# Patient Record
Sex: Male | Born: 1975 | Race: Black or African American | Hispanic: No | Marital: Married | Smoking: Current every day smoker
Health system: Southern US, Community
[De-identification: ages and names within clinical notes are randomized; demographics above are authoritative.]

## PROBLEM LIST (undated history)

## (undated) DIAGNOSIS — S61419A Laceration without foreign body of unspecified hand, initial encounter: Secondary | ICD-10-CM

## (undated) DIAGNOSIS — S66929A Laceration of unspecified muscle, fascia and tendon at wrist and hand level, unspecified hand, initial encounter: Secondary | ICD-10-CM

## (undated) DIAGNOSIS — M5412 Radiculopathy, cervical region: Secondary | ICD-10-CM

## (undated) HISTORY — PX: HAND SURGERY: SHX662

---

## 2000-07-21 ENCOUNTER — Emergency Department (HOSPITAL_COMMUNITY): Admission: EM | Admit: 2000-07-21 | Discharge: 2000-07-21 | Payer: Self-pay | Admitting: Emergency Medicine

## 2000-07-21 ENCOUNTER — Encounter: Payer: Self-pay | Admitting: Emergency Medicine

## 2000-07-31 ENCOUNTER — Ambulatory Visit (HOSPITAL_BASED_OUTPATIENT_CLINIC_OR_DEPARTMENT_OTHER): Admission: RE | Admit: 2000-07-31 | Discharge: 2000-07-31 | Payer: Self-pay | Admitting: Orthopedic Surgery

## 2000-07-31 HISTORY — PX: PERCUTANEOUS PINNING METACARPAL FRACTURE: SUR206

## 2002-10-10 ENCOUNTER — Emergency Department (HOSPITAL_COMMUNITY): Admission: EM | Admit: 2002-10-10 | Discharge: 2002-10-10 | Payer: Self-pay | Admitting: Emergency Medicine

## 2004-04-16 ENCOUNTER — Emergency Department (HOSPITAL_COMMUNITY): Admission: EM | Admit: 2004-04-16 | Discharge: 2004-04-16 | Payer: Self-pay | Admitting: Emergency Medicine

## 2004-07-25 ENCOUNTER — Emergency Department (HOSPITAL_COMMUNITY): Admission: EM | Admit: 2004-07-25 | Discharge: 2004-07-25 | Payer: Self-pay | Admitting: Family Medicine

## 2009-10-13 ENCOUNTER — Emergency Department (HOSPITAL_COMMUNITY): Admission: EM | Admit: 2009-10-13 | Discharge: 2009-10-13 | Payer: Self-pay | Admitting: Emergency Medicine

## 2011-04-27 NOTE — Op Note (Signed)
[name]. [facility]  Patient:    John Hampton, John Hampton                 MRN: [id number] Proc. Date: 07/31/00 Adm. Date:  [id number] Attending:  [name] CC:         Artist Pais Weingold, M.D. x 2   Operative Report  PREOPERATIVE DIAGNOSIS:  Displaced right fifth metacarpal fracture.  POSTOPERATIVE DIAGNOSIS:  Displaced right fifth metacarpal fracture.  PROCEDURE:  Closed reduction and percutaneous fixation of the above fracture using two 0.45 K-wires.  SURGEON:  Artist Pais. Mina Marble, M.D.  ASSISTANT:  Junius Roads. Ireton, P.A.C.  ANESTHESIA:  General anesthesia.  TOURNIQUET TIME:  15 minutes.  COMPLICATIONS:  None.  DRAINS:  None.  DESCRIPTION OF PROCEDURE:  The patient was taken to the operating room and after the induction of adequate of general anesthesia had been obtained, his right upper extremity was prepped and draped in the usual sterile fashion. Esmarch was used to exsanguinate the limb.  The tourniquet was inflated to 250 mmHg.  At this point in time, a manual reduction was performed at the base of the fifth metacarpal fracture and the intraoperative x-ray showed an adequate reduction in both the AP and lateral plane.  At this point in time, and 0.45 K-wire was driven from the lateral cortex of the fifth metacarpal across the fracture site into the base of the fourth metacarpal.  This was confirmed in both AP lateral and oblique views.  At this point in time another 0.45 K-wire was then driven distal to the fracture site again from the fifth metacarpal into the fourth metacarpal providing fixation above and below the fracture site.  Intraoperative x-ray showed adequate reduction in both AP lateral and oblique views.  The pins were then cut and bent outside the skin.  The patient was then placed in a sterile ulnar gutter splint.  The patient tolerated the procedure well and went to the recovery room in stable condition. DD:   07/31/00 TD:  07/31/00 Job: [id number]

## 2013-09-15 ENCOUNTER — Emergency Department (HOSPITAL_COMMUNITY)
Admission: EM | Admit: 2013-09-15 | Discharge: 2013-09-15 | Disposition: A | Discharge status: Home / Self Care | Payer: BC Managed Care – PPO | Attending: Emergency Medicine | Admitting: Emergency Medicine

## 2013-09-15 ENCOUNTER — Emergency Department (HOSPITAL_COMMUNITY): Payer: BC Managed Care – PPO

## 2013-09-15 ENCOUNTER — Encounter (HOSPITAL_COMMUNITY): Payer: Self-pay | Admitting: Nurse Practitioner

## 2013-09-15 DIAGNOSIS — F17200 Nicotine dependence, unspecified, uncomplicated: Secondary | ICD-10-CM | POA: Insufficient documentation

## 2013-09-15 DIAGNOSIS — S60229A Contusion of unspecified hand, initial encounter: Secondary | ICD-10-CM | POA: Insufficient documentation

## 2013-09-15 DIAGNOSIS — S60221A Contusion of right hand, initial encounter: Secondary | ICD-10-CM

## 2013-09-15 MED ORDER — OXYCODONE-ACETAMINOPHEN 5-325 MG PO TABS: 1.0000 | ORAL_TABLET | Freq: Once | ORAL | Status: DC
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
## 2013-09-15 NOTE — ED Notes (Signed)
Pt dc to home. Pt sts understanding to dc instructions. Pt ambulatory to exit without difficulty.  Pt denies need for w/c.  

## 2013-09-18 NOTE — ED Provider Notes (Signed)
Medical screening examination/treatment/procedure(s) were performed by non-physician practitioner and as supervising physician I was immediately available for consultation/collaboration.  Iveth Heidemann R. Alazar Cherian, MD 09/18/13 1019 

## 2014-01-30 ENCOUNTER — Encounter (HOSPITAL_COMMUNITY): Payer: Self-pay | Admitting: Emergency Medicine

## 2014-01-30 ENCOUNTER — Emergency Department (HOSPITAL_COMMUNITY)
Admission: EM | Admit: 2014-01-30 | Discharge: 2014-01-30 | Disposition: A | Discharge status: Home / Self Care | Payer: BC Managed Care – PPO | Attending: Emergency Medicine | Admitting: Emergency Medicine

## 2014-01-30 ENCOUNTER — Emergency Department (HOSPITAL_COMMUNITY): Payer: BC Managed Care – PPO

## 2014-01-30 DIAGNOSIS — S161XXA Strain of muscle, fascia and tendon at neck level, initial encounter: Secondary | ICD-10-CM

## 2014-01-30 DIAGNOSIS — F17200 Nicotine dependence, unspecified, uncomplicated: Secondary | ICD-10-CM | POA: Insufficient documentation

## 2014-01-30 DIAGNOSIS — M25511 Pain in right shoulder: Secondary | ICD-10-CM

## 2014-01-30 DIAGNOSIS — Y92410 Unspecified street and highway as the place of occurrence of the external cause: Secondary | ICD-10-CM | POA: Insufficient documentation

## 2014-01-30 DIAGNOSIS — S139XXA Sprain of joints and ligaments of unspecified parts of neck, initial encounter: Secondary | ICD-10-CM | POA: Insufficient documentation

## 2014-01-30 DIAGNOSIS — S46909A Unspecified injury of unspecified muscle, fascia and tendon at shoulder and upper arm level, unspecified arm, initial encounter: Secondary | ICD-10-CM | POA: Insufficient documentation

## 2014-01-30 DIAGNOSIS — S4980XA Other specified injuries of shoulder and upper arm, unspecified arm, initial encounter: Secondary | ICD-10-CM | POA: Insufficient documentation

## 2014-01-30 DIAGNOSIS — Y9389 Activity, other specified: Secondary | ICD-10-CM | POA: Insufficient documentation

## 2014-01-30 MED ORDER — METHOCARBAMOL 500 MG PO TABS: 500.0000 mg | ORAL_TABLET | Freq: Two times a day (BID) | ORAL | Status: DC
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
## 2014-01-30 NOTE — Discharge Instructions (Signed)
Please call and set-up an appointment with orthopedics, Dr. Magnus Ivan if symptoms are to worsen Please rest and stay hydrated Please take medications as prescribed-muscle relaxer Please apply with warm compressions and massage with icy hot ointment to the neck to aid in muscular relief Please avoid any physical or strenuous activity Please continue to monitor symptoms closely if symptoms are to worsen or change (fever greater 101, chills, numbness, tingling, weakness, fall, injury, headache, blurred vision or visual changes, chest pain, shortness of breath, difficulty breathing, inability to open close the jaw line) please report back to emergency department immediately  Cervical Strain and Sprain (Whiplash) with Rehab Cervical strain and sprains are injuries that commonly occur with "whiplash" injuries. Whiplash occurs when the neck is forcefully whipped backward or forward, such as during a motor vehicle accident. The muscles, ligaments, tendons, discs and nerves of the neck are susceptible to injury when this occurs. SYMPTOMS   Pain or stiffness in the front and/or back of neck  Symptoms may present immediately or up to 24 hours after injury.  Dizziness, headache, nausea and vomiting.  Muscle spasm with soreness and stiffness in the neck.  Tenderness and swelling at the injury site. CAUSES  Whiplash injuries often occur during contact sports or motor vehicle accidents.  RISK INCREASES WITH:  Osteoarthritis of the spine.  Situations that make head or neck accidents or trauma more likely.  High-risk sports (football, rugby, wrestling, hockey, auto racing, gymnastics, diving, contact karate or boxing).  Poor strength and flexibility of the neck.  Previous neck injury.  Poor tackling technique.  Improperly fitted or padded equipment. PREVENTION  Learn and use proper technique (avoid tackling with the head, spearing and head-butting; use proper falling techniques to avoid landing  on the head).  Warm up and stretch properly before activity.  Maintain physical fitness:  Strength, flexibility and endurance.  Cardiovascular fitness.  Wear properly fitted and padded protective equipment, such as padded soft collars, for participation in contact sports. PROGNOSIS  Recovery for cervical strain and sprain injuries is dependent on the extent of the injury. These injuries are usually curable in 1 week to 3 months with appropriate treatment.  RELATED COMPLICATIONS   Temporary numbness and weakness may occur if the nerve roots are damaged, and this may persist until the nerve has completely healed.  Chronic pain due to frequent recurrence of symptoms.  Prolonged healing, especially if activity is resumed too soon (before complete recovery). TREATMENT  Treatment initially involves the use of ice and medication to help reduce pain and inflammation. It is also important to perform strengthening and stretching exercises and modify activities that worsen symptoms so the injury does not get worse. These exercises may be performed at home or with a therapist. For patients who experience severe symptoms, a soft padded collar may be recommended to be worn around the neck.  Improving your posture may help reduce symptoms. Posture improvement includes pulling your chin and abdomen in while sitting or standing. If you are sitting, sit in a firm chair with your buttocks against the back of the chair. While sleeping, try replacing your pillow with a small towel rolled to 2 inches in diameter, or use a cervical pillow or soft cervical collar. Poor sleeping positions delay healing.  For patients with nerve root damage, which causes numbness or weakness, the use of a cervical traction apparatus may be recommended. Surgery is rarely necessary for these injuries. However, cervical strain and sprains that are present at birth (congenital) may  require surgery. MEDICATION   If pain medication is  necessary, nonsteroidal anti-inflammatory medications, such as aspirin and ibuprofen, or other minor pain relievers, such as acetaminophen, are often recommended.  Do not take pain medication for 7 days before surgery.  Prescription pain relievers may be given if deemed necessary by your caregiver. Use only as directed and only as much as you need. HEAT AND COLD:   Cold treatment (icing) relieves pain and reduces inflammation. Cold treatment should be applied for 10 to 15 minutes every 2 to 3 hours for inflammation and pain and immediately after any activity that aggravates your symptoms. Use ice packs or an ice massage.  Heat treatment may be used prior to performing the stretching and strengthening activities prescribed by your caregiver, physical therapist, or athletic trainer. Use a heat pack or a warm soak. SEEK MEDICAL CARE IF:   Symptoms get worse or do not improve in 2 weeks despite treatment.  New, unexplained symptoms develop (drugs used in treatment may produce side effects). EXERCISES RANGE OF MOTION (ROM) AND STRETCHING EXERCISES - Cervical Strain and Sprain These exercises may help you when beginning to rehabilitate your injury. In order to successfully resolve your symptoms, you must improve your posture. These exercises are designed to help reduce the forward-head and rounded-shoulder posture which contributes to this condition. Your symptoms may resolve with or without further involvement from your physician, physical therapist or athletic trainer. While completing these exercises, remember:   Restoring tissue flexibility helps normal motion to return to the joints. This allows healthier, less painful movement and activity.  An effective stretch should be held for at least 20 seconds, although you may need to begin with shorter hold times for comfort.  A stretch should never be painful. You should only feel a gentle lengthening or release in the stretched tissue. STRETCH-  Axial Extensors  Lie on your back on the floor. You may bend your knees for comfort. Place a rolled up hand towel or dish towel, about 2 inches in diameter, under the part of your head that makes contact with the floor.  Gently tuck your chin, as if trying to make a "double chin," until you feel a gentle stretch at the base of your head.  Hold __________ seconds. Repeat __________ times. Complete this exercise __________ times per day.  STRETECH - Axial Extension   Stand or sit on a firm surface. Assume a good posture: chest up, shoulders drawn back, abdominal muscles slightly tense, knees unlocked (if standing) and feet hip width apart.  Slowly retract your chin so your head slides back and your chin slightly lowers.Continue to look straight ahead.  You should feel a gentle stretch in the back of your head. Be certain not to feel an aggressive stretch since this can cause headaches later.  Hold for __________ seconds. Repeat __________ times. Complete this exercise __________ times per day. STRETCH  Cervical Side Bend   Stand or sit on a firm surface. Assume a good posture: chest up, shoulders drawn back, abdominal muscles slightly tense, knees unlocked (if standing) and feet hip width apart.  Without letting your nose or shoulders move, slowly tip your right / left ear to your shoulder until your feel a gentle stretch in the muscles on the opposite side of your neck.  Hold __________ seconds. Repeat __________ times. Complete this exercise __________ times per day. STRETCH  Cervical Rotators   Stand or sit on a firm surface. Assume a good posture: chest up, shoulders  drawn back, abdominal muscles slightly tense, knees unlocked (if standing) and feet hip width apart.  Keeping your eyes level with the ground, slowly turn your head until you feel a gentle stretch along the back and opposite side of your neck.  Hold __________ seconds. Repeat __________ times. Complete this exercise  __________ times per day. RANGE OF MOTION - Neck Circles   Stand or sit on a firm surface. Assume a good posture: chest up, shoulders drawn back, abdominal muscles slightly tense, knees unlocked (if standing) and feet hip width apart.  Gently roll your head down and around from the back of one shoulder to the back of the other. The motion should never be forced or painful.  Repeat the motion 10-20 times, or until you feel the neck muscles relax and loosen. Repeat __________ times. Complete the exercise __________ times per day. STRENGTHENING EXERCISES - Cervical Strain and Sprain These exercises may help you when beginning to rehabilitate your injury. They may resolve your symptoms with or without further involvement from your physician, physical therapist or athletic trainer. While completing these exercises, remember:   Muscles can gain both the endurance and the strength needed for everyday activities through controlled exercises.  Complete these exercises as instructed by your physician, physical therapist or athletic trainer. Progress the resistance and repetitions only as guided.  You may experience muscle soreness or fatigue, but the pain or discomfort you are trying to eliminate should never worsen during these exercises. If this pain does worsen, stop and make certain you are following the directions exactly. If the pain is still present after adjustments, discontinue the exercise until you can discuss the trouble with your clinician. STRENGTH Cervical Flexors, Isometric  Face a wall, standing about 6 inches away. Place a small pillow, a ball about 6-8 inches in diameter, or a folded towel between your forehead and the wall.  Slightly tuck your chin and gently push your forehead into the soft object. Push only with mild to moderate intensity, building up tension gradually. Keep your jaw and forehead relaxed.  Hold 10 to 20 seconds. Keep your breathing relaxed.  Release the tension  slowly. Relax your neck muscles completely before you start the next repetition. Repeat __________ times. Complete this exercise __________ times per day. STRENGTH- Cervical Lateral Flexors, Isometric   Stand about 6 inches away from a wall. Place a small pillow, a ball about 6-8 inches in diameter, or a folded towel between the side of your head and the wall.  Slightly tuck your chin and gently tilt your head into the soft object. Push only with mild to moderate intensity, building up tension gradually. Keep your jaw and forehead relaxed.  Hold 10 to 20 seconds. Keep your breathing relaxed.  Release the tension slowly. Relax your neck muscles completely before you start the next repetition. Repeat __________ times. Complete this exercise __________ times per day. STRENGTH  Cervical Extensors, Isometric   Stand about 6 inches away from a wall. Place a small pillow, a ball about 6-8 inches in diameter, or a folded towel between the back of your head and the wall.  Slightly tuck your chin and gently tilt your head back into the soft object. Push only with mild to moderate intensity, building up tension gradually. Keep your jaw and forehead relaxed.  Hold 10 to 20 seconds. Keep your breathing relaxed.  Release the tension slowly. Relax your neck muscles completely before you start the next repetition. Repeat __________ times. Complete this exercise __________  times per day. POSTURE AND BODY MECHANICS CONSIDERATIONS - Cervical Strain and Sprain Keeping correct posture when sitting, standing or completing your activities will reduce the stress put on different body tissues, allowing injured tissues a chance to heal and limiting painful experiences. The following are general guidelines for improved posture. Your physician or physical therapist will provide you with any instructions specific to your needs. While reading these guidelines, remember:  The exercises prescribed by your provider will  help you have the flexibility and strength to maintain correct postures.  The correct posture provides the optimal environment for your joints to work. All of your joints have less wear and tear when properly supported by a spine with good posture. This means you will experience a healthier, less painful body.  Correct posture must be practiced with all of your activities, especially prolonged sitting and standing. Correct posture is as important when doing repetitive low-stress activities (typing) as it is when doing a single heavy-load activity (lifting). PROLONGED STANDING WHILE SLIGHTLY LEANING FORWARD When completing a task that requires you to lean forward while standing in one place for a long time, place either foot up on a stationary 2-4 inch high object to help maintain the best posture. When both feet are on the ground, the low back tends to lose its slight inward curve. If this curve flattens (or becomes too large), then the back and your other joints will experience too much stress, fatigue more quickly and can cause pain.  RESTING POSITIONS Consider which positions are most painful for you when choosing a resting position. If you have pain with flexion-based activities (sitting, bending, stooping, squatting), choose a position that allows you to rest in a less flexed posture. You would want to avoid curling into a fetal position on your side. If your pain worsens with extension-based activities (prolonged standing, working overhead), avoid resting in an extended position such as sleeping on your stomach. Most people will find more comfort when they rest with their spine in a more neutral position, neither too rounded nor too arched. Lying on a non-sagging bed on your side with a pillow between your knees, or on your back with a pillow under your knees will often provide some relief. Keep in mind, being in any one position for a prolonged period of time, no matter how correct your posture, can  still lead to stiffness. WALKING Walk with an upright posture. Your ears, shoulders and hips should all line-up. OFFICE WORK When working at a desk, create an environment that supports good, upright posture. Without extra support, muscles fatigue and lead to excessive strain on joints and other tissues. CHAIR:  A chair should be able to slide under your desk when your back makes contact with the back of the chair. This allows you to work closely.  The chair's height should allow your eyes to be level with the upper part of your monitor and your hands to be slightly lower than your elbows.  Body position:  Your feet should make contact with the floor. If this is not possible, use a foot rest.  Keep your ears over your shoulders. This will reduce stress on your neck and low back. Document Released: 11/26/2005 Document Revised: 03/23/2013 Document Reviewed: 03/10/2009 [facility] Patient Information 2014 [place], [place].   Emergency Department Resource Guide 1) Find a Doctor and Pay Out of Pocket Although you won't have to find out who is covered by your insurance plan, it is a good idea to ask around  and get recommendations. You will then need to call the office and see if the doctor you have chosen will accept you as a new patient and what types of options they offer for patients who are self-pay. Some doctors offer discounts or will set up payment plans for their patients who do not have insurance, but you will need to ask so you aren't surprised when you get to your appointment.  2) Contact Your Local Health Department Not all health departments have doctors that can see patients for sick visits, but many do, so it is worth a call to see if yours does. If you don't know where your local health department is, you can check in your phone book. The CDC also has a tool to help you locate your state's health department, and many state websites also have listings of all of their local health  departments.  3) Find a Walk-in Clinic If your illness is not likely to be very severe or complicated, you may want to try a walk in clinic. These are popping up all over the country in pharmacies, drugstores, and shopping centers. They're usually staffed by nurse practitioners or physician assistants that have been trained to treat common illnesses and complaints. They're usually fairly quick and inexpensive. However, if you have serious medical issues or chronic medical problems, these are probably not your best option.  No Primary Care Doctor: - Call Health Connect at  [phone] - they can help you locate a primary care doctor that  accepts your insurance, provides certain services, etc. - Physician Referral Service- [phone]  Chronic Pain Problems: Organization         Address  Phone   Notes  [facility]  [phone] Patients need to be referred by their primary care doctor.   Medication Assistance: Organization         Address  Phone   Notes  [facility] [address] [place], [place] [address] [phone] --Must be a resident of [facility] -- Must have NO insurance coverage whatsoever (no Medicaid/ Medicare, etc.) -- The pt. MUST have a primary care doctor that directs their care regularly and follows them in the community   MedAssist  [phone]   [employer]  [phone]    Agencies that provide inexpensive medical care: Organization         Address  Phone   Notes  [facility]  [phone]   [facility]    [phone]   [facility] [address] [place], [place] [address] [phone]   [facility] [address], [place] [phone]   [facility]    [phone]   [facility]    [phone]   [facility]  [address], [place] Phone:  [phone]  [phone], Fax:  [phone] Hours of Operation:  9 am - 6 pm, M-F.  Also accepts Medicaid/Medicare and self-pay.  [facility]  [address], [place] Phone: [phone], Fax: [phone]) [phone]. Hours of Operation:  8:30 am - 5:30 pm, M-F.  Also accepts Medicaid and self-pay.  [facility] [place] [address], [place] Phone: [phone]   [facility] [address], [place], [place] [phone], Ext. [phone] Mondays & Thursdays: 7-9 AM.  First 15 patients are seen on  a first come, first serve basis.    Medicaid-accepting [facility] Providers:  Organization         Address  Phone   Notes  [facility] [address], [place] [phone] Also accepts self-pay patients.  [facility] [address] Laurell Josephs [place], [place]  [phone]   [facility] [address], [place] [phone]   [facility] [address] [place] [phone]   Renaye Rakers [address], [place]   [phone] Only accepts [place] patients after they have their name applied to their card.   Self-Pay (no insurance) in [facility]:  Organization         Address  Phone   Notes  Sickle Cell Patients, [facility] [address] [place], [place] [phone]   [facility] [address] [place], [place] [phone]   [facility]  [address], [place] [phone]   [facility]/Dr. Osei-Bonsu  [address], [place] or [address], [place] [phone] Phone number for both [place] and [place] locations is the same.  [facility] [address] [place] [phone]   [facility] [address], [place] or [address] [phone]   [facility] [address], [place] [phone], phone; [phone], fax Sees patients 1st and 3rd Saturday of every month.  Must not qualify for public or private insurance (i.e. Medicaid, Medicare, [place] Health Choice, Veterans' Benefits)  Household income should be no more than 200% of the poverty level The clinic cannot treat you if you are pregnant or think you are pregnant  Sexually transmitted diseases are not treated at the clinic.    Dental Care: Organization         Address  Phone  Notes  [facility] [address] [place], [place] [phone] Accepts children up to age 47 who are enrolled in [place] or [place] Health Choice; pregnant women with a Medicaid card; and children who have applied for Medicaid or [place] Health Choice, but were declined, whose parents can pay a reduced fee at time of service.  [facility] Department of [facility]  [address], [place] [phone] Accepts children up to age 11 who are enrolled in [place] or [place] Health Choice; pregnant women with a Medicaid card; and children who have applied for Medicaid or [place] Health Choice, but were declined, whose parents can pay a reduced fee at time of service.  [facility]  [address] [facility] [place], [place] [phone] Patients are seen by appointment only. Walk-ins are not accepted. [facility] will see patients 18 years of age and older. Monday - Tuesday (8am-5pm) Most Wednesdays (8:30-5pm) $30 per visit, cash only  [facility]  [address], [facility] [phone] Patients are seen by appointment only. Walk-ins are not accepted. [facility] will see patients 57 years of age and older. One Wednesday Evening (Monthly: Volunteer Based).  $30 per visit, cash only  [employer]  [phone] for adults;  Children under age 66, call Graduate Pediatric Dentistry at [phone]. Children  aged 41-14, please call [phone] to request a pediatric application.  Dental services are provided in all areas of dental care including fillings, crowns and bridges, complete and partial dentures, implants, gum treatment, root canals, and extractions. Preventive care is also provided. Treatment is provided to both adults and children. Patients are selected via a lottery and there is often a waiting list.   [facility] [address], [place]  [phone] [email or web address]   [facility] [address] [place], [place] [phone], Ext. [phone] Second and Fourth Thursday of each month, opens at 6:30 AM; Clinic ends at 9 AM.  Patients are seen on a first-come first-served basis, and a limited number are seen during each clinic.   [facility]  [address] [place], [place] [phone]   Eligibility Requirements You must have lived in [place], [place], or [place] counties for at least the last three months.   You cannot be eligible for state or federal sponsored [place], including [employer], [place], or [employer].   You generally cannot be eligible for healthcare insurance through your employer.    How to apply: Eligibility screenings are held every Tuesday and Wednesday afternoon from 1:00 pm until 4:00 pm. You do not need an appointment for the interview!  [facility] [address] [place], [place] [phone]   [facility]  [phone]   [facility] Department  [phone]   [facility] Department  [phone]    Behavioral Health Resources in the Community: Intensive Outpatient Programs Organization         Address  Phone  Notes  [facility] Services [address], [place], [place] [phone]   [facility] Outpatient [address], [place], [place] [phone]   [facility] [address] [place], [place]  [phone]   [facility] [address],  [place], [place] [phone] or [phone]   Substance Abuse Resources Organization         Address  Phone  Notes  [facility]  [phone]   [facility]  [phone]   [facility]  [phone]   Floydene Flock  [phone]   Residential & Outpatient Substance Abuse Program  [phone]   Psychological Services Organization         Address  Phone  Notes  [facility]  [phone]   [facility] Services  [phone]   [facility] Mental Health [address] [place] [phone] or [phone]    Mobile Crisis Teams Organization         Address  Phone  Notes  [facility], Mobile Crisis Care Unit  [phone]   [facility]  [address] [place], [place] [phone]   Doristine Locks [address] [place] [phone]    Self-Help/Support Groups Organization         Address  Phone             Notes  [facility] [place] - variety of support groups  [phone]- [phone] Call for more information  Narcotics Anonymous (NA), [facility] [address], [employer] [place]  2 meetings at this location   Statistician         Address  Phone  Notes  [facility] [address],    [place]  [phone]  [facility]  [address] [place] [address], [place], [place] [phone]   [facility] [address] [place], [place] [phone] Admissions: 8am-3pm M-F  [facility] [address],    [place], [place] [phone]   [facility] [address] [place], [place], [place] [phone]   [facility] [address]  [place], [place] [phone]   [facility] - Intensive  Outpatient [address], [place], [place] [phone]   [facility] ([facility] [address] [place]  [place], [place] [phone] or [phone]   [facility] ([facility]) [address], [place], [place] [phone] Accepts Medicaid  [facility] [address]  [place] [phone] Substance Abuse/Addiction Treatment   [facility] Organization         Address  Phone  Notes  [facility]  [phone]   Angie Fava, PhD [address] [place], [place]   [phone] or [phone]   [facility]   [address] [place], [place] [phone]   [facility] [address] [place], [place] [phone] Insurance/Medicaid/sponsorship through [facility] [address]                                    [place], [place] [phone] Therapy/tele-psych/case  [facility] [address], [place] [phone]    Dr. Lolly Mustache  [phone]   [facility]  [employer] [facility] Dept. 1) [address], [place] 2) [address] [place] 3)  [address], [place] [phone]  [phone]   [facility] Child Abuse Hotline ([phone])360-9983 or [phone] (After Hours)
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
## 2014-01-31 NOTE — ED Provider Notes (Signed)
Medical screening examination/treatment/procedure(s) were performed by non-physician practitioner and as supervising physician I was immediately available for consultation/collaboration.  EKG Interpretation   None        Shon Batonourtney F Shritha Bresee, MD 01/31/14 1919

## 2014-07-09 DIAGNOSIS — S61419A Laceration without foreign body of unspecified hand, initial encounter: Secondary | ICD-10-CM

## 2014-07-09 DIAGNOSIS — S66929A Laceration of unspecified muscle, fascia and tendon at wrist and hand level, unspecified hand, initial encounter: Secondary | ICD-10-CM

## 2014-07-09 HISTORY — DX: Laceration of unspecified muscle, fascia and tendon at wrist and hand level, unspecified hand, initial encounter: S66.929A

## 2014-07-09 HISTORY — DX: Laceration without foreign body of unspecified hand, initial encounter: S61.419A

## 2014-07-10 ENCOUNTER — Emergency Department (HOSPITAL_COMMUNITY)
Admission: EM | Admit: 2014-07-10 | Discharge: 2014-07-10 | Disposition: A | Discharge status: Home / Self Care | Payer: BC Managed Care – PPO | Attending: Emergency Medicine | Admitting: Emergency Medicine

## 2014-07-10 ENCOUNTER — Encounter (HOSPITAL_COMMUNITY): Payer: Self-pay | Admitting: Emergency Medicine

## 2014-07-10 DIAGNOSIS — Z23 Encounter for immunization: Secondary | ICD-10-CM | POA: Insufficient documentation

## 2014-07-10 DIAGNOSIS — Y9389 Activity, other specified: Secondary | ICD-10-CM | POA: Insufficient documentation

## 2014-07-10 DIAGNOSIS — S56429A Laceration of extensor muscle, fascia and tendon of unspecified finger at forearm level, initial encounter: Secondary | ICD-10-CM

## 2014-07-10 DIAGNOSIS — S61209A Unspecified open wound of unspecified finger without damage to nail, initial encounter: Secondary | ICD-10-CM | POA: Insufficient documentation

## 2014-07-10 DIAGNOSIS — Y9289 Other specified places as the place of occurrence of the external cause: Secondary | ICD-10-CM | POA: Insufficient documentation

## 2014-07-10 DIAGNOSIS — F17200 Nicotine dependence, unspecified, uncomplicated: Secondary | ICD-10-CM | POA: Insufficient documentation

## 2014-07-10 DIAGNOSIS — IMO0002 Reserved for concepts with insufficient information to code with codable children: Secondary | ICD-10-CM | POA: Insufficient documentation

## 2014-07-10 MED ORDER — TETANUS-DIPHTH-ACELL PERTUSSIS 5-2.5-18.5 LF-MCG/0.5 IM SUSP
0.5000 mL | Freq: Once | INTRAMUSCULAR | Status: AC
  Administered 2014-07-10: 0.5 mL via INTRAMUSCULAR

## 2014-07-10 MED ORDER — SULFAMETHOXAZOLE-TMP DS 800-160 MG PO TABS: 1.0000 | ORAL_TABLET | Freq: Two times a day (BID) | ORAL | Status: DC

## 2014-07-10 MED ORDER — SULFAMETHOXAZOLE-TMP DS 800-160 MG PO TABS
1.0000 | ORAL_TABLET | Freq: Once | ORAL | Status: AC
  Administered 2014-07-10: 1 via ORAL
  Filled 2014-07-10: qty 1

## 2014-07-10 MED ORDER — TETANUS-DIPHTH-ACELL PERTUSSIS 5-2.5-18.5 LF-MCG/0.5 IM SUSP
0.5000 mL | Freq: Once | INTRAMUSCULAR | Status: DC
  Filled 2014-07-10: qty 0.5

## 2014-07-10 NOTE — ED Notes (Signed)
Patient is alert and oriented x3.  He was given DC instructions and follow up visit instructions.  Patient gave verbal understanding.  He was DC ambulatory under his own power to home.  V/S stable.  He was not showing any signs of distress on DC 
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
## 2014-07-10 NOTE — Discharge Instructions (Signed)
You cut the tendon in your right thumb  It has been sutured in place but will need further repair by the hand surgeon. You have been placed on an antibiotic to prevent infection and placed in a splint to minimize further damage. Please call DR. Merrilee SeashoreKuzma's office on monday to arrange a follow up appointment
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
## 2014-07-12 ENCOUNTER — Other Ambulatory Visit: Payer: Self-pay | Admitting: Orthopedic Surgery

## 2014-07-12 ENCOUNTER — Encounter (HOSPITAL_BASED_OUTPATIENT_CLINIC_OR_DEPARTMENT_OTHER): Payer: Self-pay | Admitting: *Deleted

## 2014-07-12 NOTE — ED Provider Notes (Signed)
Medical screening examination/treatment/procedure(s) were performed by non-physician practitioner and as supervising physician I was immediately available for consultation/collaboration.   EKG Interpretation None       Sharmila Wrobleski, MD 07/12/14 1105 

## 2014-07-13 ENCOUNTER — Ambulatory Visit (HOSPITAL_BASED_OUTPATIENT_CLINIC_OR_DEPARTMENT_OTHER)
Admission: RE | Admit: 2014-07-13 | Discharge: 2014-07-13 | Disposition: A | Discharge status: Home / Self Care | Payer: BC Managed Care – PPO | Source: Ambulatory Visit | Attending: Orthopedic Surgery | Admitting: Orthopedic Surgery

## 2014-07-13 ENCOUNTER — Encounter (HOSPITAL_BASED_OUTPATIENT_CLINIC_OR_DEPARTMENT_OTHER)
Admission: RE | Disposition: A | Discharge status: Home / Self Care | Payer: Self-pay | Source: Ambulatory Visit | Attending: Orthopedic Surgery

## 2014-07-13 ENCOUNTER — Ambulatory Visit (HOSPITAL_BASED_OUTPATIENT_CLINIC_OR_DEPARTMENT_OTHER): Payer: BC Managed Care – PPO | Admitting: Certified Registered"

## 2014-07-13 ENCOUNTER — Encounter (HOSPITAL_BASED_OUTPATIENT_CLINIC_OR_DEPARTMENT_OTHER): Payer: BC Managed Care – PPO | Admitting: Certified Registered"

## 2014-07-13 ENCOUNTER — Encounter (HOSPITAL_BASED_OUTPATIENT_CLINIC_OR_DEPARTMENT_OTHER): Payer: Self-pay | Admitting: Certified Registered"

## 2014-07-13 DIAGNOSIS — F17200 Nicotine dependence, unspecified, uncomplicated: Secondary | ICD-10-CM | POA: Insufficient documentation

## 2014-07-13 DIAGNOSIS — Y9289 Other specified places as the place of occurrence of the external cause: Secondary | ICD-10-CM | POA: Insufficient documentation

## 2014-07-13 DIAGNOSIS — S66909A Unspecified injury of unspecified muscle, fascia and tendon at wrist and hand level, unspecified hand, initial encounter: Principal | ICD-10-CM

## 2014-07-13 DIAGNOSIS — S56909A Unspecified injury of unspecified muscles, fascia and tendons at forearm level, unspecified arm, initial encounter: Secondary | ICD-10-CM

## 2014-07-13 DIAGNOSIS — Y9389 Activity, other specified: Secondary | ICD-10-CM | POA: Insufficient documentation

## 2014-07-13 DIAGNOSIS — Z885 Allergy status to narcotic agent status: Secondary | ICD-10-CM | POA: Insufficient documentation

## 2014-07-13 DIAGNOSIS — S51809A Unspecified open wound of unspecified forearm, initial encounter: Secondary | ICD-10-CM | POA: Insufficient documentation

## 2014-07-13 DIAGNOSIS — Y998 Other external cause status: Secondary | ICD-10-CM | POA: Insufficient documentation

## 2014-07-13 DIAGNOSIS — S61409A Unspecified open wound of unspecified hand, initial encounter: Secondary | ICD-10-CM | POA: Insufficient documentation

## 2014-07-13 DIAGNOSIS — W268XXA Contact with other sharp object(s), not elsewhere classified, initial encounter: Secondary | ICD-10-CM | POA: Insufficient documentation

## 2014-07-13 HISTORY — PX: TENDON REPAIR: SHX5111

## 2014-07-13 HISTORY — DX: Laceration without foreign body of unspecified hand, initial encounter: S61.419A

## 2014-07-13 HISTORY — PX: WOUND EXPLORATION: SHX6188

## 2014-07-13 HISTORY — DX: Laceration of unspecified muscle, fascia and tendon at wrist and hand level, unspecified hand, initial encounter: S66.929A

## 2014-07-13 LAB — POCT HEMOGLOBIN-HEMACUE: Hemoglobin: 14.2 g/dL (ref 13.0–17.0)

## 2014-07-13 SURGERY — WOUND EXPLORATION
Anesthesia: General | Site: Hand | Laterality: Right

## 2014-07-13 MED ORDER — 0.9 % SODIUM CHLORIDE (POUR BTL) OPTIME
TOPICAL | Status: DC | PRN
  Administered 2014-07-13: 400 mL

## 2014-07-13 MED ORDER — OXYCODONE HCL 5 MG PO TABS
ORAL_TABLET | ORAL | Status: AC
  Filled 2014-07-13: qty 1

## 2014-07-13 MED ORDER — FENTANYL CITRATE 0.05 MG/ML IJ SOLN
INTRAMUSCULAR | Status: DC | PRN
  Administered 2014-07-13: 50 ug via INTRAVENOUS
  Administered 2014-07-13 (×2): 25 ug via INTRAVENOUS
  Administered 2014-07-13 (×2): 50 ug via INTRAVENOUS

## 2014-07-13 MED ORDER — MIDAZOLAM HCL 2 MG/2ML IJ SOLN: 1.0000 mg | INTRAMUSCULAR | Status: DC | PRN

## 2014-07-13 MED ORDER — BUPIVACAINE HCL (PF) 0.25 % IJ SOLN
INTRAMUSCULAR | Status: DC | PRN
  Administered 2014-07-13: 9.5 mL

## 2014-07-13 MED ORDER — PROPOFOL 10 MG/ML IV BOLUS
INTRAVENOUS | Status: DC | PRN
  Administered 2014-07-13: 200 mg via INTRAVENOUS

## 2014-07-13 MED ORDER — FENTANYL CITRATE 0.05 MG/ML IJ SOLN: 50.0000 ug | INTRAMUSCULAR | Status: DC | PRN

## 2014-07-13 MED ORDER — OXYCODONE HCL 5 MG PO TABS
5.0000 mg | ORAL_TABLET | Freq: Once | ORAL | Status: AC | PRN
  Administered 2014-07-13: 5 mg via ORAL

## 2014-07-13 MED ORDER — FENTANYL CITRATE 0.05 MG/ML IJ SOLN
INTRAMUSCULAR | Status: AC
  Filled 2014-07-13: qty 8

## 2014-07-13 MED ORDER — MIDAZOLAM HCL 2 MG/ML PO SYRP: 12.0000 mg | ORAL_SOLUTION | Freq: Once | ORAL | Status: DC | PRN

## 2014-07-13 MED ORDER — LIDOCAINE HCL (CARDIAC) 20 MG/ML IV SOLN
INTRAVENOUS | Status: DC | PRN
  Administered 2014-07-13: 60 mg via INTRAVENOUS

## 2014-07-13 MED ORDER — CHLORHEXIDINE GLUCONATE 4 % EX LIQD: 60.0000 mL | Freq: Once | CUTANEOUS | Status: DC

## 2014-07-13 MED ORDER — MIDAZOLAM HCL 5 MG/5ML IJ SOLN
INTRAMUSCULAR | Status: DC | PRN
  Administered 2014-07-13: 2 mg via INTRAVENOUS

## 2014-07-13 MED ORDER — MORPHINE SULFATE 2 MG/ML IJ SOLN: 1.0000 mg | INTRAMUSCULAR | Status: DC | PRN

## 2014-07-13 MED ORDER — ONDANSETRON HCL 4 MG/2ML IJ SOLN
INTRAMUSCULAR | Status: DC | PRN
  Administered 2014-07-13: 4 mg via INTRAVENOUS

## 2014-07-13 MED ORDER — CEFAZOLIN SODIUM-DEXTROSE 2-3 GM-% IV SOLR
INTRAVENOUS | Status: AC
  Filled 2014-07-13: qty 50

## 2014-07-13 MED ORDER — MIDAZOLAM HCL 2 MG/2ML IJ SOLN
INTRAMUSCULAR | Status: AC
  Filled 2014-07-13: qty 2

## 2014-07-13 MED ORDER — LACTATED RINGERS IV SOLN
INTRAVENOUS | Status: DC
Start: 2014-07-13 — End: 2014-07-13
  Administered 2014-07-13 (×2): via INTRAVENOUS

## 2014-07-13 MED ORDER — CEFAZOLIN SODIUM-DEXTROSE 2-3 GM-% IV SOLR
2.0000 g | INTRAVENOUS | Status: AC
  Administered 2014-07-13: 2 g via INTRAVENOUS

## 2014-07-13 MED ORDER — DEXAMETHASONE SODIUM PHOSPHATE 10 MG/ML IJ SOLN
INTRAMUSCULAR | Status: DC | PRN
  Administered 2014-07-13: 10 mg via INTRAVENOUS

## 2014-07-13 MED ORDER — OXYCODONE HCL 5 MG/5ML PO SOLN: 5.0000 mg | Freq: Once | ORAL | Status: AC | PRN

## 2014-07-13 MED ORDER — HYDROCODONE-ACETAMINOPHEN 5-325 MG PO TABS: ORAL_TABLET | ORAL | Status: DC

## 2014-07-13 SURGICAL SUPPLY — 59 items
BAG DECANTER FOR FLEXI CONT (MISCELLANEOUS) IMPLANT
BANDAGE ELASTIC 3 VELCRO ST LF (GAUZE/BANDAGES/DRESSINGS) ×3 IMPLANT
BLADE MINI RND TIP GREEN BEAV (BLADE) IMPLANT
BLADE SURG 15 STRL LF DISP TIS (BLADE) ×2 IMPLANT
BLADE SURG 15 STRL SS (BLADE) ×4
BNDG ESMARK 4X9 LF (GAUZE/BANDAGES/DRESSINGS) ×3 IMPLANT
BNDG GAUZE ELAST 4 BULKY (GAUZE/BANDAGES/DRESSINGS) ×3 IMPLANT
CHLORAPREP W/TINT 26ML (MISCELLANEOUS) ×3 IMPLANT
CORDS BIPOLAR (ELECTRODE) ×3 IMPLANT
COVER MAYO STAND STRL (DRAPES) ×3 IMPLANT
COVER TABLE BACK 60X90 (DRAPES) ×3 IMPLANT
CUFF TOURNIQUET SINGLE 18IN (TOURNIQUET CUFF) ×3 IMPLANT
DECANTER SPIKE VIAL GLASS SM (MISCELLANEOUS) IMPLANT
DEPRESSOR TONGUE BLADE STERILE (MISCELLANEOUS) ×3 IMPLANT
DRAPE EXTREMITY T 121X128X90 (DRAPE) ×3 IMPLANT
DRAPE SURG 17X23 STRL (DRAPES) ×3 IMPLANT
GAUZE SPONGE 4X4 12PLY STRL (GAUZE/BANDAGES/DRESSINGS) ×3 IMPLANT
GAUZE XEROFORM 1X8 LF (GAUZE/BANDAGES/DRESSINGS) ×3 IMPLANT
GLOVE BIO SURGEON STRL SZ7.5 (GLOVE) ×3 IMPLANT
GLOVE BIOGEL PI IND STRL 8 (GLOVE) ×2 IMPLANT
GLOVE BIOGEL PI IND STRL 8.5 (GLOVE) ×1 IMPLANT
GLOVE BIOGEL PI INDICATOR 8 (GLOVE) ×4
GLOVE BIOGEL PI INDICATOR 8.5 (GLOVE) ×2
GLOVE SURG ORTHO 8.0 STRL STRW (GLOVE) ×3 IMPLANT
GLOVE SURG SS PI 8.0 STRL IVOR (GLOVE) ×3 IMPLANT
GOWN STRL REUS W/ TWL LRG LVL3 (GOWN DISPOSABLE) ×1 IMPLANT
GOWN STRL REUS W/ TWL XL LVL3 (GOWN DISPOSABLE) ×1 IMPLANT
GOWN STRL REUS W/TWL LRG LVL3 (GOWN DISPOSABLE) ×2
GOWN STRL REUS W/TWL XL LVL3 (GOWN DISPOSABLE) ×5 IMPLANT
LOOP VESSEL MAXI BLUE (MISCELLANEOUS) IMPLANT
NDL SAFETY ECLIPSE 18X1.5 (NEEDLE) IMPLANT
NEEDLE HYPO 18GX1.5 SHARP (NEEDLE)
NEEDLE HYPO 25X1 1.5 SAFETY (NEEDLE) ×3 IMPLANT
NS IRRIG 1000ML POUR BTL (IV SOLUTION) ×3 IMPLANT
PACK BASIN DAY SURGERY FS (CUSTOM PROCEDURE TRAY) ×3 IMPLANT
PAD CAST 3X4 CTTN HI CHSV (CAST SUPPLIES) ×1 IMPLANT
PAD CAST 4YDX4 CTTN HI CHSV (CAST SUPPLIES) ×1 IMPLANT
PADDING CAST ABS 4INX4YD NS (CAST SUPPLIES) ×2
PADDING CAST ABS COTTON 4X4 ST (CAST SUPPLIES) ×1 IMPLANT
PADDING CAST COTTON 3X4 STRL (CAST SUPPLIES) ×2
PADDING CAST COTTON 4X4 STRL (CAST SUPPLIES) ×2
SLEEVE SCD COMPRESS KNEE MED (MISCELLANEOUS) ×3 IMPLANT
SPEAR EYE SURG WECK-CEL (MISCELLANEOUS) IMPLANT
SPLINT PLASTER CAST XFAST 3X15 (CAST SUPPLIES) ×20 IMPLANT
SPLINT PLASTER XTRA FASTSET 3X (CAST SUPPLIES) ×40
STOCKINETTE 4X48 STRL (DRAPES) ×3 IMPLANT
SUT ETHIBOND 3-0 V-5 (SUTURE) ×3 IMPLANT
SUT ETHILON 4 0 PS 2 18 (SUTURE) ×6 IMPLANT
SUT FIBERWIRE 4-0 18 TAPR NDL (SUTURE)
SUT MERSILENE 6 0 P 1 (SUTURE) IMPLANT
SUT NYLON 9 0 VRM6 (SUTURE) IMPLANT
SUT PROLENE 6 0 P 1 18 (SUTURE) IMPLANT
SUT SILK 4 0 PS 2 (SUTURE) IMPLANT
SUT VICRYL 4-0 PS2 18IN ABS (SUTURE) IMPLANT
SUTURE FIBERWR 4-0 18 TAPR NDL (SUTURE) IMPLANT
SYR BULB 3OZ (MISCELLANEOUS) ×3 IMPLANT
SYR CONTROL 10ML LL (SYRINGE) ×3 IMPLANT
TOWEL OR 17X24 6PK STRL BLUE (TOWEL DISPOSABLE) ×6 IMPLANT
UNDERPAD 30X30 INCONTINENT (UNDERPADS AND DIAPERS) ×3 IMPLANT

## 2014-07-13 NOTE — Anesthesia Procedure Notes (Signed)
Procedure Name: LMA Insertion Date/Time: 07/13/2014 2:08 PM Performed by: Kongmeng Santoro Pre-anesthesia Checklist: Patient identified, Emergency Drugs available, Suction available and Patient being monitored Patient Re-evaluated:Patient Re-evaluated prior to inductionOxygen Delivery Method: Circle System Utilized Preoxygenation: Pre-oxygenation with 100% oxygen Intubation Type: IV induction Ventilation: Mask ventilation without difficulty LMA: LMA inserted LMA Size: 4.0 Number of attempts: 1 Airway Equipment and Method: bite block Placement Confirmation: positive ETCO2 Tube secured with: Tape Dental Injury: Teeth and Oropharynx as per pre-operative assessment

## 2014-07-13 NOTE — Brief Op Note (Signed)
07/13/2014  3:00 PM  PATIENT:  John Hampton  38 y.o. male  PRE-OPERATIVE DIAGNOSIS:  RIGHT EXTENSOR POLLICIS BREVIS LACERATION   POST-OPERATIVE DIAGNOSIS:  RIGHT EXTENSOR POLLICIS BREVIS LACERATION   PROCEDURE:  Procedure(s): RIGHT HAND EXPLORATION WOUNDS, REPAIR EXTENSOR POLLICIS BREVIS TENDON (Right)  SURGEON:  Surgeon(s) and Role:    * Tami RibasKevin R Mikaila Grunert, MD - Primary    * Nicki ReaperGary R Jaquez Farrington, MD - Assisting  PHYSICIAN ASSISTANT:   ASSISTANTS: Cindee SaltGary Liza Czerwinski, MD   ANESTHESIA:   general  EBL:  Total I/O In: 1500 [I.V.:1500] Out: -   BLOOD ADMINISTERED:none  DRAINS: none   LOCAL MEDICATIONS USED:  MARCAINE     SPECIMEN:  No Specimen  DISPOSITION OF SPECIMEN:  N/A  COUNTS:  YES  TOURNIQUET:   Total Tourniquet Time Documented: Upper Arm (Right) - 36 minutes Total: Upper Arm (Right) - 36 minutes   DICTATION: .Other Dictation: Dictation Number [id number]  PLAN OF CARE: Discharge to home after PACU  PATIENT DISPOSITION:  PACU - hemodynamically stable.

## 2014-07-13 NOTE — Anesthesia Postprocedure Evaluation (Signed)
  Anesthesia Post-op Note  Patient: John HalonMerrill D Seidel  Procedure(s) Performed: Procedure(s): EXPLORATION RIGHT HAND WOUNDS (Right) REPAIR ABDUCTOR POLLICIS LONGUS AND EXTENSOR POLLICIS BREVIS TENDONS (Right)  Patient Location: PACU  Anesthesia Type:General  Level of Consciousness: awake and alert   Airway and Oxygen Therapy: Patient Spontanous Breathing  Post-op Pain: mild  Post-op Assessment: Post-op Vital signs reviewed, Patient's Cardiovascular Status Stable and Respiratory Function Stable  Post-op Vital Signs: Reviewed  Filed Vitals:   07/13/14 1530  BP: 117/67  Pulse: 89  Temp:   Resp: 15    Complications: No apparent anesthesia complications

## 2014-07-13 NOTE — Op Note (Signed)
[id number] 
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
## 2014-07-13 NOTE — Anesthesia Preprocedure Evaluation (Signed)
Anesthesia Evaluation  Patient identified by MRN, date of birth, ID band Patient awake    Reviewed: Allergy & Precautions, H&P , NPO status , Patient's Chart, lab work & pertinent test results  Airway Mallampati: II  TM Distance: >3 FB Neck ROM: Full    Dental no notable dental hx. (+) Teeth Intact, Dental Advisory Given   Pulmonary Current Smoker,  breath sounds clear to auscultation  Pulmonary exam normal       Cardiovascular negative cardio ROS  Rhythm:Regular Rate:Normal     Neuro/Psych negative neurological ROS  negative psych ROS   GI/Hepatic negative GI ROS, Neg liver ROS,   Endo/Other  negative endocrine ROS  Renal/GU negative Renal ROS  negative genitourinary   Musculoskeletal   Abdominal   Peds  Hematology negative hematology ROS (+)   Anesthesia Other Findings   Reproductive/Obstetrics negative OB ROS                            Anesthesia Physical Anesthesia Plan  ASA: II  Anesthesia Plan: General   Post-op Pain Management:    Induction: Intravenous  Airway Management Planned: LMA  Additional Equipment:   Intra-op Plan:   Post-operative Plan: Extubation in OR  Informed Consent: I have reviewed the patients History and Physical, chart, labs and discussed the procedure including the risks, benefits and alternatives for the proposed anesthesia with the patient or authorized representative who has indicated his/her understanding and acceptance.   Dental advisory given  Plan Discussed with: CRNA  Anesthesia Plan Comments:         Anesthesia Quick Evaluation  

## 2014-07-13 NOTE — Transfer of Care (Signed)
Immediate Anesthesia Transfer of Care Note  Patient: John Hampton  Procedure(s) Performed: Procedure(s): RIGHT HAND EXPLORATION WOUNDS (Right) REPAIR ABDUCTOR POLLICIS LONGUS AND EXTENSOR POLLICIS BREVIS TENDONS (Right)  Patient Location: PACU  Anesthesia Type:General  Level of Consciousness: awake and patient cooperative  Airway & Oxygen Therapy: Patient Spontanous Breathing and Patient connected to face mask oxygen  Post-op Assessment: Report given to PACU RN and Post -op Vital signs reviewed and stable  Post vital signs: Reviewed and stable  Complications: No apparent anesthesia complications

## 2014-07-13 NOTE — Discharge Instructions (Signed)

## 2014-07-14 ENCOUNTER — Encounter (HOSPITAL_BASED_OUTPATIENT_CLINIC_OR_DEPARTMENT_OTHER): Payer: Self-pay | Admitting: Orthopedic Surgery
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
## 2015-07-28 IMAGING — CR DG SHOULDER 2+V*R*
3 series · 3 of 3 positions shown · non-contrast
Comparison: None

CLINICAL DATA: Right neck and right shoulder pain since this
morning

EXAM:
RIGHT SHOULDER - 2+ VIEW

[w shoulder external right]
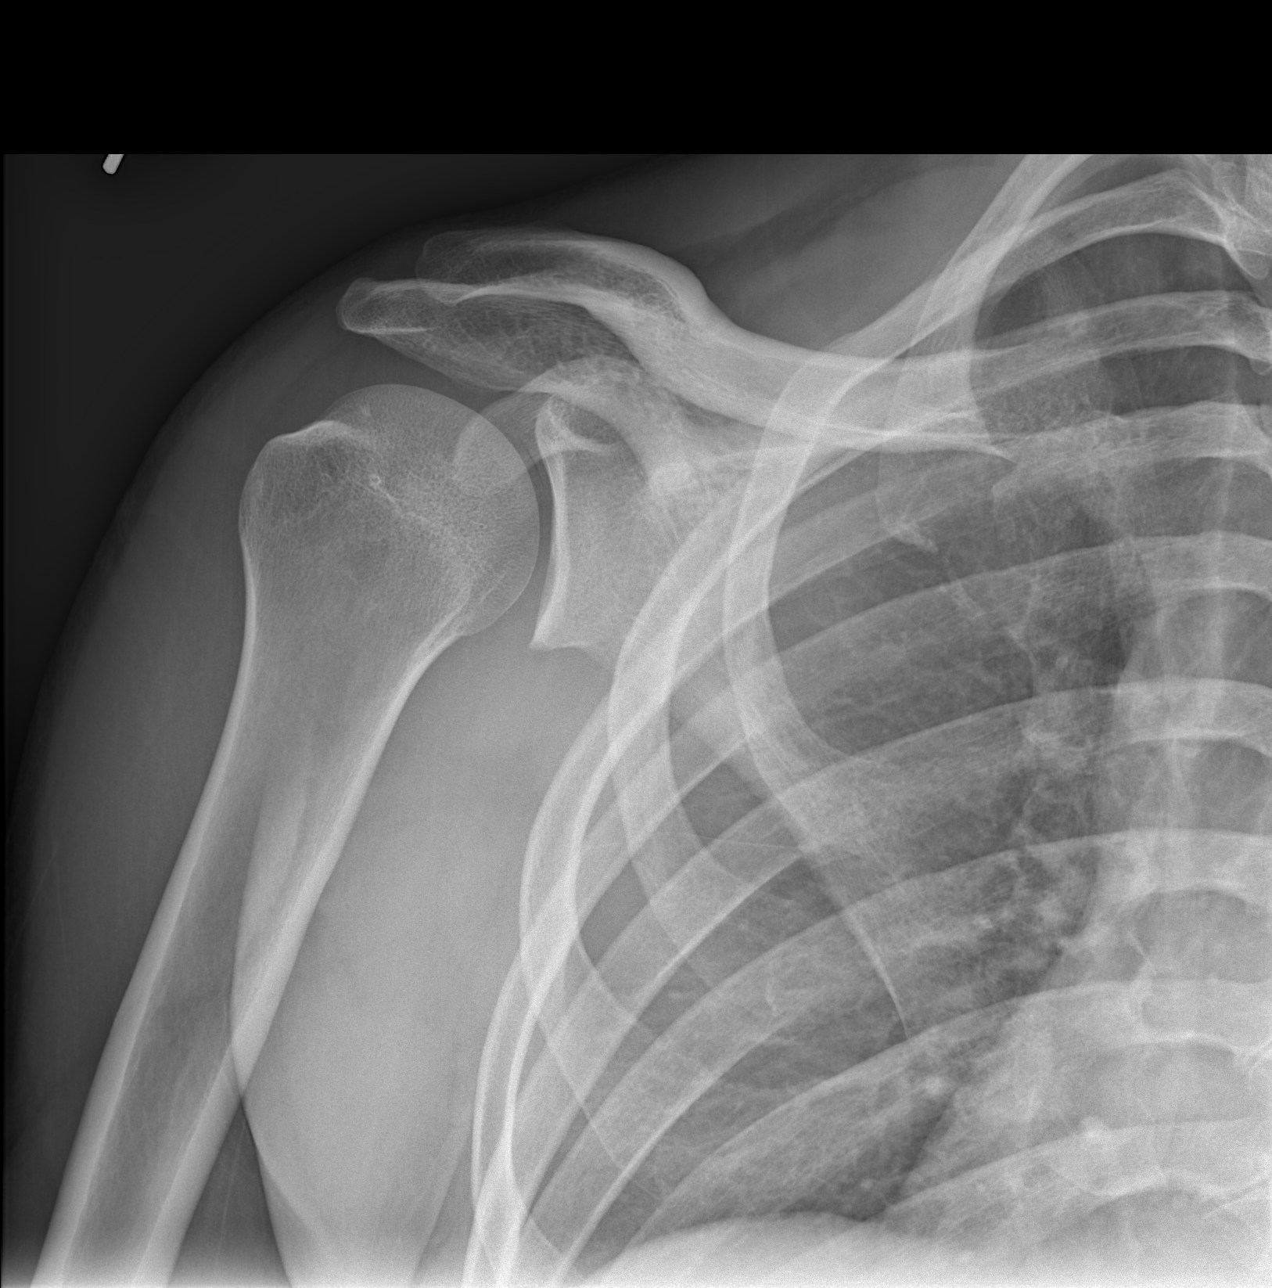

[w shoulder y-view right]
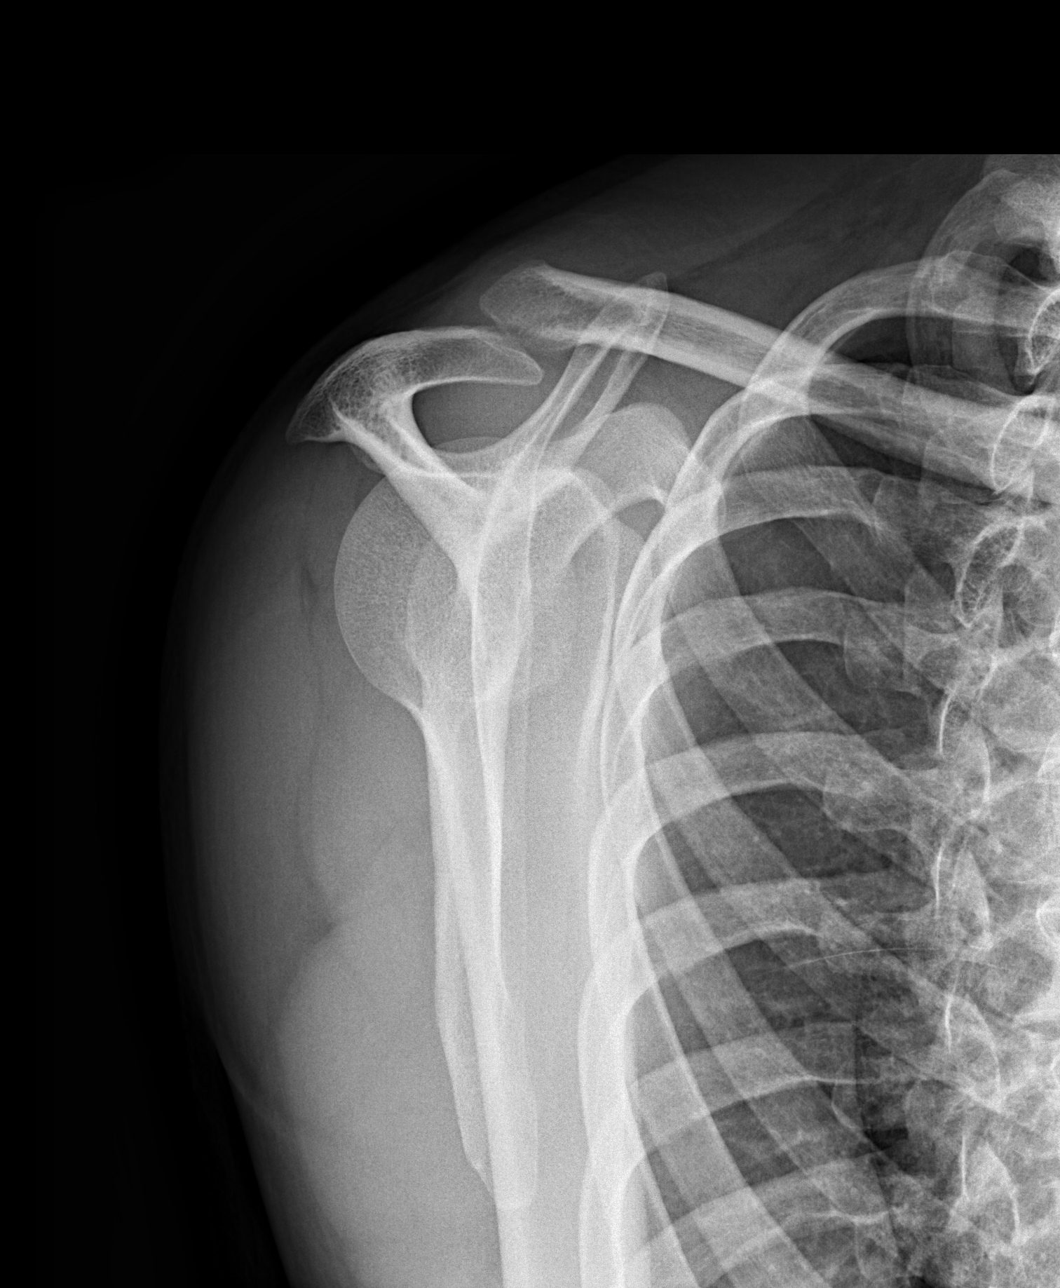

[x shoulder axillary right]
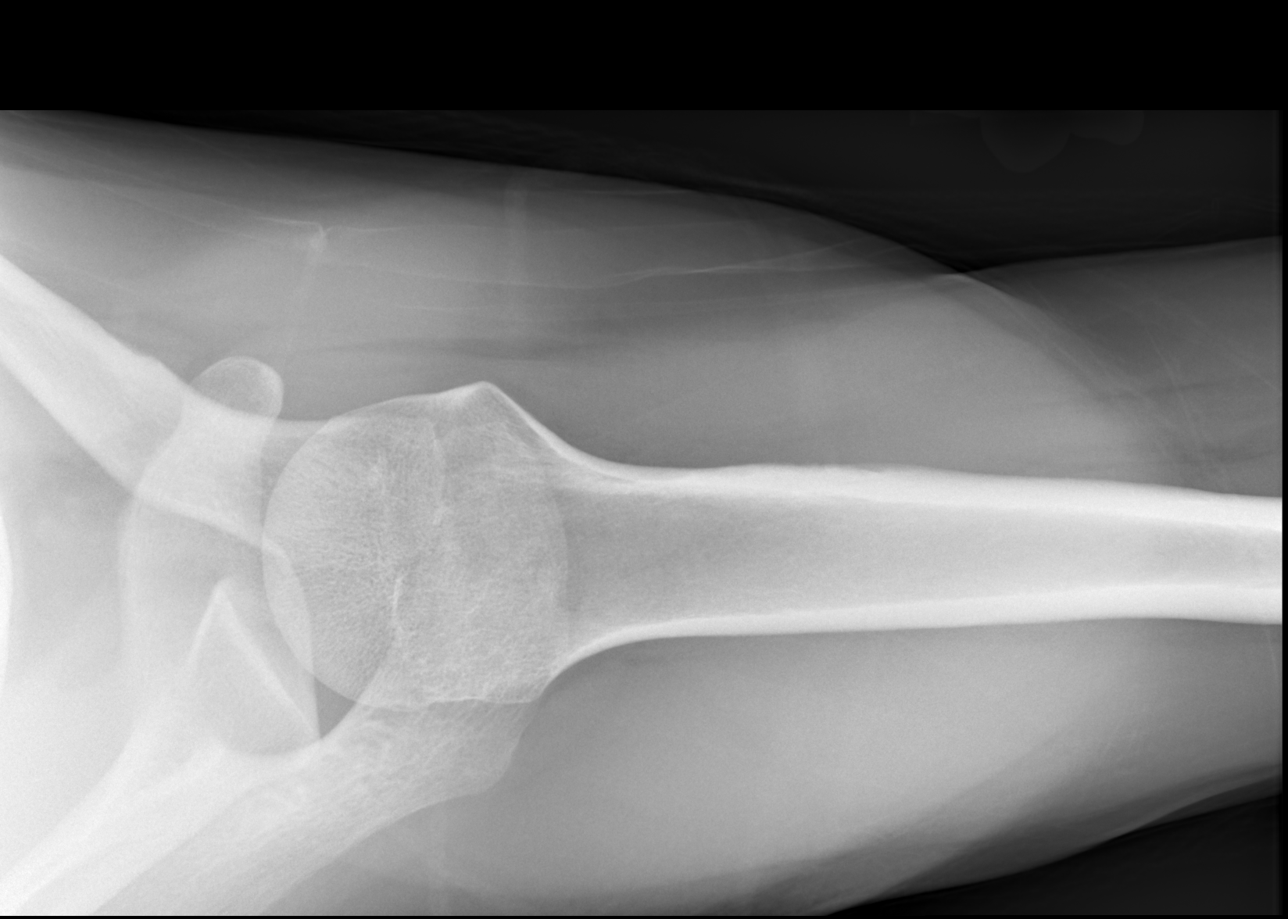

[3 of 3 positions shown; findings below may reference images not displayed]

FINDINGS: Osseous mineralization normal.

AC joint alignment normal.

No acute fracture, dislocation or bone destruction.

Visualized left ribs unremarkable.
IMPRESSION: Normal exam.
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
## 2017-08-14 ENCOUNTER — Emergency Department (HOSPITAL_COMMUNITY)
Admission: EM | Admit: 2017-08-14 | Discharge: 2017-08-14 | Disposition: A | Discharge status: Home / Self Care | Payer: BC Managed Care – PPO | Attending: Emergency Medicine | Admitting: Emergency Medicine

## 2017-08-14 ENCOUNTER — Encounter (HOSPITAL_COMMUNITY): Payer: Self-pay | Admitting: *Deleted

## 2017-08-14 DIAGNOSIS — B309 Viral conjunctivitis, unspecified: Secondary | ICD-10-CM | POA: Diagnosis not present

## 2017-08-14 DIAGNOSIS — H11432 Conjunctival hyperemia, left eye: Secondary | ICD-10-CM | POA: Diagnosis present

## 2017-08-14 DIAGNOSIS — F17210 Nicotine dependence, cigarettes, uncomplicated: Secondary | ICD-10-CM | POA: Diagnosis not present

## 2017-08-14 DIAGNOSIS — Z79899 Other long term (current) drug therapy: Secondary | ICD-10-CM | POA: Insufficient documentation

## 2017-08-14 MED ORDER — TETRACAINE HCL 0.5 % OP SOLN
1.0000 [drp] | Freq: Once | OPHTHALMIC | Status: AC
  Administered 2017-08-14: 2 [drp] via OPHTHALMIC
  Filled 2017-08-14: qty 4

## 2017-08-14 MED ORDER — FLUORESCEIN SODIUM 0.6 MG OP STRP
1.0000 | ORAL_STRIP | Freq: Once | OPHTHALMIC | Status: AC
  Administered 2017-08-14: 1 via OPHTHALMIC
  Filled 2017-08-14: qty 1

## 2017-08-14 MED ORDER — ERYTHROMYCIN 5 MG/GM OP OINT: TOPICAL_OINTMENT | OPHTHALMIC | 0 refills | Status: DC

## 2017-08-14 NOTE — ED Provider Notes (Signed)
[facility] Provider Note   CSN: [id number] Arrival date & time: 08/14/17  1013     History   Chief Complaint Chief Complaint  Patient presents with  . Eye Problem    HPI John Hampton is a 41 y.o. male who presents with left eye redness and irritation. He states that this started about 2 days ago. Nothing is making better or worse. Denies any sick contacts. He does have children but they do not have similar symptoms. He denies getting anything in his eye. He does not wear contacts or glasses. He also has some right eye itching and redness but his left is worse. He has watery drainage from the area. No fever or URI symptoms.  HPI  Past Medical History:  Diagnosis Date  . Laceration of hand with tendon involvement 07/09/2014   extensor pollicis brevis lac. - right    There are no active problems to display for this patient.   Past Surgical History:  Procedure Laterality Date  . HAND SURGERY Right   . PERCUTANEOUS PINNING METACARPAL FRACTURE Right 07/31/2000   right 5th  . TENDON REPAIR Right 07/13/2014   Procedure: REPAIR ABDUCTOR POLLICIS LONGUS AND EXTENSOR POLLICIS BREVIS TENDONS;  Surgeon: Betha LoaKevin Kuzma, MD;  Location: [facility];  Service: Orthopedics;  Laterality: Right;  . WOUND EXPLORATION Right 07/13/2014   Procedure: EXPLORATION RIGHT HAND WOUNDS;  Surgeon: Betha LoaKevin Kuzma, MD;  Location: [facility];  Service: Orthopedics;  Laterality: Right;       Home Medications    Prior to Admission medications   Medication Sig Start Date End Date Taking? Authorizing Provider  HYDROcodone-acetaminophen (NORCO) 5-325 MG per tablet 1-2 tabs po q6 hours prn pain 07/13/14   Betha LoaKuzma, Kevin, MD  sulfamethoxazole-trimethoprim (BACTRIM DS) 800-160 MG per tablet Take 1 tablet by mouth 2 (two) times daily. 07/10/14   Earley FavorSchulz, Gail, NP    Family History History reviewed. No pertinent family history.  Social History Social History  Substance Use Topics   . Smoking status: Current Every Day Smoker    Years: 20.00    Types: Cigarettes  . Smokeless tobacco: Never Used     Comment: 6 cig./day  . Alcohol use Yes     Comment: weekends and an additional beer during the week     Allergies   Dilaudid [hydromorphone hcl]   Review of Systems Review of Systems  Constitutional: Negative for fever.  HENT: Negative for congestion, ear pain, rhinorrhea and sore throat.   Eyes: Positive for pain, discharge, redness and itching. Negative for photophobia and visual disturbance.     Physical Exam Updated Vital Signs BP (!) 137/94 (BP Location: Left Arm)   Pulse 74   Temp 98 F (36.7 C) (Oral)   Resp 16   SpO2 98%   Physical Exam  Constitutional: He is oriented to person, place, and time. He appears well-developed and well-nourished. No distress.  HENT:  Head: Normocephalic and atraumatic.  Vision is 20/20 bilaterally  Eyes: Pupils are equal, round, and reactive to light. EOM and lids are normal. Right eye exhibits no discharge. Left eye exhibits no discharge. Right conjunctiva is injected. Left conjunctiva is injected. No scleral icterus.  Slit lamp exam:      The right eye shows no corneal abrasion, no foreign body and no fluorescein uptake.       The left eye shows no corneal abrasion, no foreign body and no fluorescein uptake.  Neck: Normal range of motion.  Cardiovascular:  Normal rate.   Pulmonary/Chest: Effort normal. No respiratory distress.  Abdominal: He exhibits no distension.  Neurological: He is alert and oriented to person, place, and time.  Skin: Skin is warm and dry.  Psychiatric: He has a normal mood and affect. His behavior is normal.  Nursing note and vitals reviewed.    ED Treatments / Results  Labs (all labs ordered are listed, but only abnormal results are displayed) Labs Reviewed - No data to display  EKG  EKG Interpretation None       Radiology No results found.  Procedures Procedures (including  critical care time)  Medications Ordered in ED Medications  fluorescein ophthalmic strip 1 strip (1 strip Left Eye Given by Other 08/14/17 1137)  tetracaine (PONTOCAINE) 0.5 % ophthalmic solution 1-2 drop (2 drops Left Eye Given by Other 08/14/17 1136)     Initial Impression / Assessment and Plan / ED Course  I have reviewed the triage vital signs and the nursing notes.  Pertinent labs & imaging results that were available during my care of the patient were reviewed by me and considered in my medical decision making (see chart for details).  41 year old male with bilateral viral conjunctivitis. Visual acuity is intact. No fluorescein uptake on exam. Will rx erythromycin and discussed symptomatic care. Return precautions given.  Final Clinical Impressions(s) / ED Diagnoses   Final diagnoses:  Viral conjunctivitis of both eyes    New Prescriptions New Prescriptions   No medications on file     Beryle Quant 08/14/17 1215    Donnetta Hutching, MD 08/17/17 [phone]

## 2017-08-14 NOTE — ED Notes (Signed)
See EDP secondary assessment.  

## 2017-08-14 NOTE — ED Triage Notes (Signed)
Pt reports left eye redness and irritation x 2 days. Denies injury to eye. Reports eye watering and itching, no vision changes.

## 2017-08-14 NOTE — Discharge Instructions (Signed)
Apply ointment for the next several days Please wash hands and avoid touching your eye Return for worsening symptoms

## 2017-08-19 ENCOUNTER — Ambulatory Visit (HOSPITAL_COMMUNITY)
Admission: EM | Admit: 2017-08-19 | Discharge: 2017-08-19 | Disposition: A | Discharge status: Home / Self Care | Payer: BC Managed Care – PPO | Attending: Family Medicine | Admitting: Family Medicine

## 2017-08-19 ENCOUNTER — Encounter (HOSPITAL_COMMUNITY): Payer: Self-pay | Admitting: Emergency Medicine

## 2017-08-19 DIAGNOSIS — H1033 Unspecified acute conjunctivitis, bilateral: Secondary | ICD-10-CM | POA: Diagnosis not present

## 2017-08-19 MED ORDER — OFLOXACIN 0.3 % OP SOLN: 1.0000 [drp] | Freq: Four times a day (QID) | OPHTHALMIC | 0 refills | Status: AC

## 2017-08-19 MED ORDER — OLOPATADINE HCL 0.2 % OP SOLN: 1.0000 [drp] | Freq: Every day | OPHTHALMIC | 0 refills | Status: DC

## 2017-08-19 NOTE — ED Provider Notes (Signed)
[facility]    CSN: [id number] Arrival date & time: 08/19/17  1541     History   Chief Complaint Chief Complaint  Patient presents with  . Eye Problem    HPI John Hampton is a 41 y.o. male.   41 year old male comes in for one-week history of bilateral eye irritation. Patient states that symptoms first started in the left eye, and has come to the right. He was seen at the emergency department about a week ago for symptoms, and was diagnosed with viral conjunctivitis, erythromycin was given. He states that he has been using erythromycin gel as directed, without any improvement. He continues to have watery eyes throughout the day, and notices crusting first thing in the morning. Denies photophobia, vision changes, injuries. His work involves lots of dust, and has noticed increased irritation with it. States her eyes are itchy, with foreign body sensation, denies pain. Denies contact lens use. Denies history of seasonal allergies, but does have intermittent sneezing, denies rhinorrhea.      Past Medical History:  Diagnosis Date  . Laceration of hand with tendon involvement 07/09/2014   extensor pollicis brevis lac. - right    There are no active problems to display for this patient.   Past Surgical History:  Procedure Laterality Date  . HAND SURGERY Right   . PERCUTANEOUS PINNING METACARPAL FRACTURE Right 07/31/2000   right 5th  . TENDON REPAIR Right 07/13/2014   Procedure: REPAIR ABDUCTOR POLLICIS LONGUS AND EXTENSOR POLLICIS BREVIS TENDONS;  Surgeon: Betha LoaKevin Kuzma, MD;  Location: [facility];  Service: Orthopedics;  Laterality: Right;  . WOUND EXPLORATION Right 07/13/2014   Procedure: EXPLORATION RIGHT HAND WOUNDS;  Surgeon: Betha LoaKevin Kuzma, MD;  Location: [facility];  Service: Orthopedics;  Laterality: Right;       Home Medications    Prior to Admission medications   Medication Sig Start Date End Date Taking? Authorizing Provider    HYDROcodone-acetaminophen (NORCO) 5-325 MG per tablet 1-2 tabs po q6 hours prn pain 07/13/14   Betha LoaKuzma, Kevin, MD  ofloxacin (OCUFLOX) 0.3 % ophthalmic solution Place 1 drop into both eyes 4 (four) times daily. 08/19/17 08/26/17  Cathie HoopsYu, Sundae Maners V, PA-C  Olopatadine HCl 0.2 % SOLN Apply 1 drop to eye daily. 08/19/17   Cathie HoopsYu, Nicolas Banh V, PA-C  sulfamethoxazole-trimethoprim (BACTRIM DS) 800-160 MG per tablet Take 1 tablet by mouth 2 (two) times daily. 07/10/14   Earley FavorSchulz, Gail, NP    Family History No family history on file.  Social History Social History  Substance Use Topics  . Smoking status: Current Every Day Smoker    Years: 20.00    Types: Cigarettes  . Smokeless tobacco: Never Used     Comment: 6 cig./day  . Alcohol use Yes     Comment: weekends and an additional beer during the week     Allergies   Dilaudid [hydromorphone hcl]   Review of Systems Review of Systems  Reason unable to perform ROS: See HPI as above.     Physical Exam Triage Vital Signs ED Triage Vitals  Enc Vitals Group     BP 08/19/17 1612 128/87     Pulse Rate 08/19/17 1612 73     Resp 08/19/17 1612 16     Temp 08/19/17 1612 98.6 F (37 C)     Temp Source 08/19/17 1612 Oral     SpO2 08/19/17 1612 100 %     Weight 08/19/17 1611 150 lb (68 kg)  Height 08/19/17 1611  (1.727 m)     Head Circumference --      Peak Flow --      Pain Score 08/19/17 1611 4     Pain Loc --      Pain Edu? --      Excl. in GC? --    No data found.   Updated Vital Signs BP 128/87   Pulse 73   Temp 98.6 F (37 C) (Oral)   Resp 16   Ht  (1.727 m)   Wt 150 lb (68 kg)   SpO2 100%   BMI 22.81 kg/m   Visual Acuity Right Eye Distance:  20/25 Left Eye Distance:  20/40 Bilateral Distance:    Right Eye Near:   Left Eye Near:    Bilateral Near:     Physical Exam  Constitutional: He is oriented to person, place, and time. He appears well-developed and well-nourished. No distress.  HENT:  Head: Normocephalic and  atraumatic.  Eyes: Pupils are equal, round, and reactive to light. EOM are normal. Lids are everted and swept, no foreign bodies found. Right conjunctiva is injected. Left conjunctiva is injected.  Eyelid inverted, with cobblestoning bilaterally. Patient's eyes continue to water on exam.   Fluorescein stain without uptake.  Neurological: He is alert and oriented to person, place, and time.     UC Treatments / Results  Labs (all labs ordered are listed, but only abnormal results are displayed) Labs Reviewed - No data to display  EKG  EKG Interpretation None       Radiology No results found.  Procedures Procedures (including critical care time)  Medications Ordered in UC Medications - No data to display   Initial Impression / Assessment and Plan / UC Course  I have reviewed the triage vital signs and the nursing notes.  Pertinent labs & imaging results that were available during my care of the patient were reviewed by me and considered in my medical decision making (see chart for details).    Given erythromycin without improvement, will switch to ofloxacin to cover for bacterial conjunctivitis. Discussed with patient symptoms could be due to allergic conjunctivitis, or irritation from dusting at work. Start pataday for allergic conjunctivitis. Artificial tear drops for dry eyes. Warm compresses and lid scrubs. Patient to wear protective wear at work. Patient to follow up with ophthalmology if symptoms not improving, worsens.  Final Clinical Impressions(s) / UC Diagnoses   Final diagnoses:  Acute conjunctivitis of both eyes, unspecified acute conjunctivitis type    New Prescriptions New Prescriptions   OFLOXACIN (OCUFLOX) 0.3 % OPHTHALMIC SOLUTION    Place 1 drop into both eyes 4 (four) times daily.   OLOPATADINE HCL 0.2 % SOLN    Apply 1 drop to eye daily.      Belinda Fisher, PA-C 08/19/17 1700

## 2017-08-19 NOTE — Discharge Instructions (Signed)
Start ofloxacin drops as directed for possible bacterial infection, discontinue ointment. Start pataday drops for allergies. Wait 10-15 mins between drops. Wear protective wear at work to prevent further irritation. Monitor for any worsening of symptoms, changes in vision, sensitivity to light, follow up with an ophthalmologist.

## 2017-08-19 NOTE — ED Triage Notes (Signed)
PT reports his left eye started bothering him Tuesday. PT reports he is now having symptoms in both eyes. PT reports redness, itching, and drainage. PT was seen in ER Tuesday and given a prescription. PT has used med since Wednesday without relief.

## 2017-10-19 ENCOUNTER — Other Ambulatory Visit: Payer: Self-pay

## 2017-10-19 ENCOUNTER — Encounter (HOSPITAL_COMMUNITY): Payer: Self-pay | Admitting: *Deleted

## 2017-10-19 ENCOUNTER — Emergency Department (HOSPITAL_COMMUNITY)
Admission: EM | Admit: 2017-10-19 | Discharge: 2017-10-19 | Disposition: A | Discharge status: Home / Self Care | Payer: BC Managed Care – PPO | Attending: Emergency Medicine | Admitting: Emergency Medicine

## 2017-10-19 ENCOUNTER — Emergency Department (HOSPITAL_COMMUNITY): Payer: BC Managed Care – PPO

## 2017-10-19 DIAGNOSIS — Y939 Activity, unspecified: Secondary | ICD-10-CM | POA: Insufficient documentation

## 2017-10-19 DIAGNOSIS — Y929 Unspecified place or not applicable: Secondary | ICD-10-CM | POA: Diagnosis not present

## 2017-10-19 DIAGNOSIS — S0992XA Unspecified injury of nose, initial encounter: Secondary | ICD-10-CM | POA: Diagnosis present

## 2017-10-19 DIAGNOSIS — S01511A Laceration without foreign body of lip, initial encounter: Secondary | ICD-10-CM | POA: Diagnosis not present

## 2017-10-19 DIAGNOSIS — F17210 Nicotine dependence, cigarettes, uncomplicated: Secondary | ICD-10-CM | POA: Diagnosis not present

## 2017-10-19 DIAGNOSIS — Z79899 Other long term (current) drug therapy: Secondary | ICD-10-CM | POA: Diagnosis not present

## 2017-10-19 DIAGNOSIS — S022XXA Fracture of nasal bones, initial encounter for closed fracture: Secondary | ICD-10-CM | POA: Insufficient documentation

## 2017-10-19 DIAGNOSIS — Y999 Unspecified external cause status: Secondary | ICD-10-CM | POA: Insufficient documentation

## 2017-10-19 MED ORDER — AMOXICILLIN-POT CLAVULANATE 875-125 MG PO TABS: 1.0000 | ORAL_TABLET | Freq: Two times a day (BID) | ORAL | 0 refills | Status: DC

## 2017-10-19 MED ORDER — LIDOCAINE-EPINEPHRINE (PF) 2 %-1:200000 IJ SOLN
10.0000 mL | Freq: Once | INTRAMUSCULAR | Status: AC
  Administered 2017-10-19: 10 mL
  Filled 2017-10-19: qty 20

## 2017-10-19 MED ORDER — IBUPROFEN 800 MG PO TABS: 800.0000 mg | ORAL_TABLET | Freq: Three times a day (TID) | ORAL | 0 refills | Status: AC
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
## 2017-10-19 NOTE — ED Notes (Signed)
Patient verbalized understanding of discharge instructions and denies any further needs or questions at this time. VS stable. Patient ambulatory with steady gait. Provided another ice pack and escorted to ED entrance.
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
## 2017-10-19 NOTE — ED Notes (Signed)
Patient transported to CT 

## 2017-10-19 NOTE — Discharge Instructions (Signed)
Take ibuprofen 3 times a day with meals.  Do not take other anti-inflammatories at the same time (Advil, Motrin, naproxen, Aleve).  You may supplement with Tylenol if you need further pain control. Take Augmentin as prescribed.  Take the entire course of antibiotics. Use ice as frequently as you can throughout the day.  Apply ice to your lips and your nose for 20 minutes at a time. Make sure that you are eating and drinking soft foods.  Try not to irritate the cuts or sutures with your tongue. Return to the ER or to urgent care for wound recheck in 3 days. Return to the emergency room if you develop vision changes, vomiting, fevers, pain with movement of your eyes, or any new or worsening symptoms.

## 2017-10-23 ENCOUNTER — Encounter (HOSPITAL_COMMUNITY): Payer: Self-pay | Admitting: Emergency Medicine

## 2017-10-23 ENCOUNTER — Emergency Department (HOSPITAL_COMMUNITY)
Admission: EM | Admit: 2017-10-23 | Discharge: 2017-10-23 | Disposition: A | Discharge status: Home / Self Care | Payer: BC Managed Care – PPO | Attending: Emergency Medicine | Admitting: Emergency Medicine

## 2017-10-23 ENCOUNTER — Other Ambulatory Visit: Payer: Self-pay

## 2017-10-23 DIAGNOSIS — K069 Disorder of gingiva and edentulous alveolar ridge, unspecified: Secondary | ICD-10-CM

## 2017-10-23 DIAGNOSIS — Y33XXXD Other specified events, undetermined intent, subsequent encounter: Secondary | ICD-10-CM | POA: Diagnosis not present

## 2017-10-23 DIAGNOSIS — Z09 Encounter for follow-up examination after completed treatment for conditions other than malignant neoplasm: Secondary | ICD-10-CM

## 2017-10-23 DIAGNOSIS — S01511D Laceration without foreign body of lip, subsequent encounter: Secondary | ICD-10-CM | POA: Diagnosis present

## 2017-10-23 DIAGNOSIS — Z4802 Encounter for removal of sutures: Secondary | ICD-10-CM

## 2017-10-23 DIAGNOSIS — F17210 Nicotine dependence, cigarettes, uncomplicated: Secondary | ICD-10-CM | POA: Insufficient documentation

## 2017-10-23 DIAGNOSIS — K0501 Acute gingivitis, non-plaque induced: Secondary | ICD-10-CM | POA: Diagnosis not present

## 2017-10-23 MED ORDER — CHLORHEXIDINE GLUCONATE 0.12% ORAL RINSE (MEDLINE KIT): 15.0000 mL | Freq: Two times a day (BID) | OROMUCOSAL | 0 refills | Status: DC

## 2017-10-23 NOTE — ED Triage Notes (Signed)
Pt states he had stitches placed in his lower lip on Saturday and is here to have them removed.
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
## 2017-10-23 NOTE — ED Provider Notes (Signed)
SUTURE REMOVAL Performed by: Palma HolterKanishka G Gunadasa  Consent: Verbal consent obtained. Patient identity confirmed: provided demographic data Time out: Immediately prior to procedure a "time out" was called to verify the correct patient, procedure, equipment, support staff and site/side marked as required.  Location details: lower left lip  Wound Appearance: clean  Sutures/Staples Removed: 1  Facility: sutures placed in this facility Patient tolerance: Patient tolerated the procedure well with no immediate complications.     Palma HolterGunadasa, Kanishka G, MD 10/23/17 1431    Doug SouJacubowitz, Sam, MD 10/23/17 [phone]

## 2017-10-23 NOTE — Discharge Instructions (Signed)
You were seen here today for suture removal.  One suture was removed.  You were also found to have gingival disease.  I am prescribing you a chlorhexidine mouthwash that you are to SWISH AND SPIT for 30 seconds twice daily (morning and evening) after toothbrushing.  He will need to follow with a dentist.  I you with a referral and resources for this.  Please continue taking your antibiotic to completion.  He can follow-up in the emergency department for any other concerns.

## 2018-06-10 ENCOUNTER — Other Ambulatory Visit: Payer: Self-pay

## 2018-06-10 ENCOUNTER — Emergency Department (HOSPITAL_COMMUNITY): Payer: BC Managed Care – PPO

## 2018-06-10 ENCOUNTER — Encounter (HOSPITAL_COMMUNITY): Payer: Self-pay

## 2018-06-10 ENCOUNTER — Emergency Department (HOSPITAL_COMMUNITY)
Admission: EM | Admit: 2018-06-10 | Discharge: 2018-06-10 | Disposition: A | Discharge status: Home / Self Care | Payer: BC Managed Care – PPO | Attending: Emergency Medicine | Admitting: Emergency Medicine

## 2018-06-10 DIAGNOSIS — Z79899 Other long term (current) drug therapy: Secondary | ICD-10-CM | POA: Insufficient documentation

## 2018-06-10 DIAGNOSIS — F17210 Nicotine dependence, cigarettes, uncomplicated: Secondary | ICD-10-CM | POA: Diagnosis not present

## 2018-06-10 DIAGNOSIS — R918 Other nonspecific abnormal finding of lung field: Secondary | ICD-10-CM

## 2018-06-10 DIAGNOSIS — R0789 Other chest pain: Secondary | ICD-10-CM | POA: Insufficient documentation

## 2018-06-10 LAB — CBC WITH DIFFERENTIAL/PLATELET
Abs Immature Granulocytes: 0 10*3/uL (ref 0.0–0.1)
BASOS ABS: 0.1 10*3/uL (ref 0.0–0.1)
Basophils Relative: 1 %
EOS ABS: 0.1 10*3/uL (ref 0.0–0.7)
Eosinophils Relative: 1 %
HCT: 42.5 % (ref 39.0–52.0)
Hemoglobin: 14.5 g/dL (ref 13.0–17.0)
Immature Granulocytes: 0 %
Lymphocytes Relative: 15 %
Lymphs Abs: 1.3 10*3/uL (ref 0.7–4.0)
MCH: 30.2 pg (ref 26.0–34.0)
MCHC: 34.1 g/dL (ref 30.0–36.0)
MCV: 88.5 fL (ref 78.0–100.0)
Monocytes Absolute: 0.7 10*3/uL (ref 0.1–1.0)
Monocytes Relative: 9 %
NEUTROS PCT: 74 %
Neutro Abs: 6.3 10*3/uL (ref 1.7–7.7)
PLATELETS: 258 10*3/uL (ref 150–400)
RBC: 4.8 MIL/uL (ref 4.22–5.81)
RDW: 12.6 % (ref 11.5–15.5)
WBC: 8.5 10*3/uL (ref 4.0–10.5)

## 2018-06-10 LAB — I-STAT TROPONIN, ED: Troponin i, poc: 0 ng/mL (ref 0.00–0.08)

## 2018-06-10 LAB — I-STAT CHEM 8, ED
BUN: 8 mg/dL (ref 6–20)
Calcium, Ion: 1.19 mmol/L (ref 1.15–1.40)
Chloride: 102 mmol/L (ref 98–111)
Creatinine, Ser: 0.9 mg/dL (ref 0.61–1.24)
Glucose, Bld: 93 mg/dL (ref 70–99)
HCT: 44 % (ref 39.0–52.0)
Hemoglobin: 15 g/dL (ref 13.0–17.0)
Potassium: 4.1 mmol/L (ref 3.5–5.1)
Sodium: 139 mmol/L (ref 135–145)
TCO2: 26 mmol/L (ref 22–32)

## 2018-06-10 MED ORDER — AZITHROMYCIN 250 MG PO TABS: 250.0000 mg | ORAL_TABLET | Freq: Every day | ORAL | 0 refills | Status: AC

## 2018-06-10 MED ORDER — IBUPROFEN 400 MG PO TABS: 400.0000 mg | ORAL_TABLET | Freq: Four times a day (QID) | ORAL | 0 refills | Status: AC | PRN

## 2018-06-10 MED ORDER — AZITHROMYCIN 250 MG PO TABS
500.0000 mg | ORAL_TABLET | Freq: Once | ORAL | Status: AC
  Administered 2018-06-10: 500 mg via ORAL
  Filled 2018-06-10: qty 2

## 2018-06-10 MED ORDER — IBUPROFEN 400 MG PO TABS
600.0000 mg | ORAL_TABLET | Freq: Once | ORAL | Status: AC
  Administered 2018-06-10: 600 mg via ORAL
  Filled 2018-06-10: qty 1

## 2018-06-10 MED ORDER — IOHEXOL 300 MG/ML  SOLN
75.0000 mL | Freq: Once | INTRAMUSCULAR | Status: AC | PRN
  Administered 2018-06-10: 75 mL via INTRAVENOUS

## 2018-06-10 NOTE — Discharge Instructions (Addendum)
Please call  to schedule a follow-up CT scan of your chest in 3 months. Please call community health and wellness to establish primary care provider. Return to the emergency department for any new or worsening symptoms. 4.  Please take your antibiotic medication as prescribed.  Contact a health care provider if: You have a fever. Your chest pain becomes worse. You have new symptoms. Get help right away if: You have nausea or vomiting. You feel sweaty or light-headed. You have a cough with phlegm (sputum) or you cough up blood. You develop shortness of breath.

## 2018-06-10 NOTE — ED Notes (Signed)
Patient currently in MRI.

## 2018-06-10 NOTE — ED Triage Notes (Signed)
Pt presents with sudden onset of R sided rib pain x 3 days.  Pt denies any shortness of breath, denies any injury; reports area is tender to palpation, reports pain worsens with deep inspiration.

## 2018-06-10 NOTE — ED Provider Notes (Signed)
Patient placed in Quick Look pathway, seen and evaluated   Chief Complaint: right side pain  HPI:   John Hampton is a 42 y.o. male who presents to the ED with right side pain that started 3 days ago and has stayed the same. The pain increases with deep breath and movement. Pain is 8/10. No known injury.  ROS: Resp: no shortness of breath, no cough  M/S: right rib pain  Physical Exam:  BP (!) 133/96 (BP Location: Right Arm)   Pulse 81   Temp 98.4 F (36.9 C) (Oral)   Resp 16   SpO2 100%    Gen: No distress  Neuro: Awake and Alert  Skin: Warm and dry, no rash noted  Resp: no distress, normal breathing  M/S: right rib tenderness on palpation      Initiation of care has begun. The patient has been counseled on the process, plan, and necessity for staying for the completion/evaluation, and the remainder of the medical screening examination    Janne Napoleoneese, Hope M, NP 06/10/18 1521    Gerhard MunchLockwood, Robert, MD 06/10/18 2350

## 2018-06-10 NOTE — ED Provider Notes (Signed)
[facility] EMERGENCY DEPARTMENT Provider Note   CSN: [id number] Arrival date & time: 06/10/18  1506     History   Chief Complaint Chief Complaint  Patient presents with  . Chest Pain    HPI John Hampton is a 42 y.o. male presented for right sided lower chest wall pain that began 3 days ago.  Patient states that pain has been gradually increasing in severity since Sunday.  Patient states that the pain is now a sharp 8/10 pain along the right lower ribs  that worsens with deep inspiration and movement.  Patient denies history of trauma to the area.  Patient states that he has not taken anything for his pain. Patient denies history of cough, fever, shortness of breath, nausea/vomiting, diarrhea, abdominal pain, history of blood clots or leg pain/swelling.    Past Medical History:  Diagnosis Date  . Laceration of hand with tendon involvement 2/48/2500   extensor pollicis brevis lac. - right    There are no active problems to display for this patient.   Past Surgical History:  Procedure Laterality Date  . HAND SURGERY Right   . PERCUTANEOUS PINNING METACARPAL FRACTURE Right 07/31/2000   right 5th  . TENDON REPAIR Right 07/13/2014   Procedure: REPAIR ABDUCTOR POLLICIS LONGUS AND EXTENSOR POLLICIS BREVIS TENDONS;  Surgeon: Leanora Cover, MD;  Location: [facility];  Service: Orthopedics;  Laterality: Right;  . WOUND EXPLORATION Right 07/13/2014   Procedure: EXPLORATION RIGHT HAND WOUNDS;  Surgeon: Leanora Cover, MD;  Location: [facility];  Service: Orthopedics;  Laterality: Right;       Home Medications    Prior to Admission medications   Medication Sig Start Date End Date Taking? Authorizing Provider  amoxicillin-clavulanate (AUGMENTIN) 875-125 MG tablet Take 1 tablet every 12 (twelve) hours by mouth. Patient not taking: Reported on 06/10/2018 10/19/17   Caccavale, Sophia, PA-C  azithromycin (ZITHROMAX) 250 MG tablet Take 1  tablet (250 mg total) by mouth daily. Take one tablet by mouth daily for the next 4 days. 06/10/18   Nuala Alpha A, PA-C  chlorhexidine gluconate, MEDLINE KIT, (PERIDEX) 0.12 % solution Use as directed 15 mLs 2 (two) times daily in the mouth or throat. SWISH AND SPIT for 30 seconds twice daily (morning and evening) after toothbrushing Patient not taking: Reported on 06/10/2018 10/23/17   Maczis, Barth Kirks, PA-C  HYDROcodone-acetaminophen [facility]) 5-325 MG per tablet 1-2 tabs po q6 hours prn pain Patient not taking: Reported on 06/10/2018 07/13/14   Leanora Cover, MD  ibuprofen (ADVIL,MOTRIN) 400 MG tablet Take 1 tablet (400 mg total) by mouth every 6 (six) hours as needed. 06/10/18   Nuala Alpha A, PA-C  Olopatadine HCl 0.2 % SOLN Apply 1 drop to eye daily. Patient not taking: Reported on 06/10/2018 08/19/17   Ok Edwards, PA-C  sulfamethoxazole-trimethoprim (BACTRIM DS) 800-160 MG per tablet Take 1 tablet by mouth 2 (two) times daily. Patient not taking: Reported on 06/10/2018 07/10/14   Junius Creamer, NP    Family History History reviewed. No pertinent family history.  Social History Social History   Tobacco Use  . Smoking status: Current Every Day Smoker    Years: 20.00    Types: Cigarettes  . Smokeless tobacco: Never Used  . Tobacco comment: 6 cig./day  Substance Use Topics  . Alcohol use: Yes    Comment: weekends and an additional beer during the week  . Drug use: No     Allergies   Dilaudid [  hydromorphone hcl]   Review of Systems Review of Systems  Constitutional: Negative.  Negative for chills, fatigue and fever.  HENT: Negative.  Negative for congestion, ear pain, rhinorrhea, sore throat and trouble swallowing.   Eyes: Negative.  Negative for visual disturbance.  Respiratory: Negative.  Negative for cough, chest tightness and shortness of breath.   Cardiovascular: Positive for chest pain. Negative for leg swelling.       Right sided rib pain  Gastrointestinal: Negative.   Negative for abdominal pain, blood in stool, diarrhea, nausea and vomiting.  Genitourinary: Negative.  Negative for dysuria, flank pain and hematuria.  Musculoskeletal: Negative.  Negative for arthralgias, myalgias and neck pain.  Skin: Negative.  Negative for rash.  Neurological: Negative.  Negative for dizziness, syncope, weakness, light-headedness and headaches.     Physical Exam Updated Vital Signs BP (!) 133/99   Pulse 63   Temp 98.4 F (36.9 C) (Oral)   Resp 13   Ht 5' 7"  (1.702 m)   Wt 70.3 kg (155 lb)   SpO2 100%   BMI 24.28 kg/m   Physical Exam  Constitutional: He is oriented to person, place, and time. He appears well-developed and well-nourished. No distress.  HENT:  Head: Normocephalic and atraumatic.  Eyes: Pupils are equal, round, and reactive to light.  Neck: Normal range of motion. Neck supple. No JVD present. No tracheal deviation present.  Cardiovascular: Normal rate, regular rhythm, intact distal pulses and normal pulses.    No systolic murmur is present.  No diastolic murmur is present. Pulmonary/Chest: Effort normal and breath sounds normal. No accessory muscle usage or stridor. No respiratory distress. He has no wheezes. He has no rales. He exhibits tenderness. He exhibits no laceration, no crepitus, no deformity and no swelling.    Abdominal: Soft. There is no tenderness. There is no rebound and no guarding.  Musculoskeletal: Normal range of motion.       Right lower leg: Normal. He exhibits no edema.       Left lower leg: Normal. He exhibits no edema.  Neurological: He is alert and oriented to person, place, and time.  Skin: Skin is warm and dry.  Psychiatric: He has a normal mood and affect. His behavior is normal.     ED Treatments / Results  Labs (all labs ordered are listed, but only abnormal results are displayed) Labs Reviewed  CBC WITH DIFFERENTIAL/PLATELET  I-STAT CHEM 8, ED  I-STAT TROPONIN, ED    EKG EKG  Interpretation  Date/Time:  Tuesday June 10 2018 17:37:49 EDT Ventricular Rate:  70 PR Interval:    QRS Duration: 117 QT Interval:  407 QTC Calculation: 440 R Axis:   9 Text Interpretation:  Sinus rhythm Probable left atrial enlargement Incomplete right bundle branch block ST elev, probable normal early repol pattern credit repeat Confirmed by Pattricia Boss [phone]) on 06/10/2018 7:10:41 PM   Radiology Dg Ribs Unilateral W/chest Right  Result Date: 06/10/2018 CLINICAL DATA:  Acute right-sided axillary to lower rib cage pain several days ago EXAM: RIGHT RIBS AND CHEST - 3+ VIEW COMPARISON:  Chest x-ray 04/16/2004 FINDINGS: No active infiltrate or effusion is seen. However, deep at the right lung base there appears to be a spiculated nodular opacity present of approximately 1.3 cm in diameter. This is worrisome for possible neoplasm and CT of the chest with IV contrast media is recommended. Otherwise the lungs are clear. Mediastinal and hilar contours are unremarkable. The heart is within normal limits in size. Right rib  detail films show no acute fracture. No rib abnormality is noted. Buttons overlie the right ribs IMPRESSION: 1. Questionable spiculated lesion deep at the right lung base of approximately 13 mm in diameter. Recommend CT of the chest with IV contrast media. 2. Negative right rib detail. Electronically Signed   By: Ivar Drape M.D.   On: 06/10/2018 15:59   Ct Chest W Contrast  Result Date: 06/10/2018 CLINICAL DATA:  42 year old male with RIGHT LOWER chest and rib pain for 3 days. Possible RIGHT LOWER lung nodule on recent radiographs EXAM: CT CHEST WITH CONTRAST TECHNIQUE: Multidetector CT imaging of the chest was performed during intravenous contrast administration. CONTRAST:  29m OMNIPAQUE IOHEXOL 300 MG/ML  SOLN COMPARISON:  06/10/2018 radiographs FINDINGS: Cardiovascular: No significant vascular findings. Normal heart size. No pericardial effusion. Mediastinum/Nodes: No enlarged  mediastinal, hilar, or axillary lymph nodes. Thyroid gland, trachea, and esophagus demonstrate no significant findings. Lungs/Pleura: A 1.2 cm ill-defined peripheral focal opacity within the posterior basilar aspect of the RIGHT LOWER lobe is noted, suspect atelectasis or less likely infection/inflammation rather than a true nodule. The remainder of the lungs are clear. No consolidation, pleural effusion or pneumothorax. Upper Abdomen: Hepatic steatosis noted.  No acute abnormality. Musculoskeletal: No chest wall abnormality. No acute or significant osseous findings. IMPRESSION: 1. 1.2 cm ill-defined opacity within the posterior basilar RIGHT LOWER lobe-favor atelectasis or less likely infection/inflammation. Three month CT follow-up recommended to ensure improvement/resolution. 2. Hepatic steatosis Electronically Signed   By: JMargarette CanadaM.D.   On: 06/10/2018 19:08    Procedures Procedures (including critical care time)  Medications Ordered in ED Medications  ibuprofen (ADVIL,MOTRIN) tablet 600 mg (600 mg Oral Given 06/10/18 1527)  iohexol (OMNIPAQUE) 300 MG/ML solution 75 mL (75 mLs Intravenous Contrast Given 06/10/18 1827)  azithromycin (ZITHROMAX) tablet 500 mg (500 mg Oral Given 06/10/18 1946)     Initial Impression / Assessment and Plan / ED Course  I have reviewed the triage vital signs and the nursing notes.  Pertinent labs & imaging results that were available during my care of the patient were reviewed by me and considered in my medical decision making (see chart for details).     Patient states some relief of pain with the 600 mg ibuprofen given in the department.  CT chest shows a 1.2 cm ill-defined opacity within the posterior basilar RIGHT LOWER lobe-favor atelectasis or less likely infection/inflammation. Three month CT follow-up recommended to ensure improvement/resolution.  Patient has been treated with first dose of azithromycin 500 mg in department today.  Patient given 4 more  days of azithromycin to treat possible infection.  Patient also given prescription for ibuprofen as needed for pain.  Patient made aware of CT chest findings and of the importance to have a follow-up CT scan of his chest in 3 months.  Patient counseled on importance of smoking cessation.  Patient is to be discharged with recommendation to follow up with PCP in regards to today's hospital visit. Chest pain is not likely of cardiac or pulmonary etiology d/t presentation, perc negative, VSS, no tracheal deviation, no JVD or new murmur, RRR, breath sounds equal bilaterally, EKG without acute abnormalities, negative troponin.  It is likely that the patient's chest wall/rib pain is muscular skeletal in nature.  Reproducible with palpation to the area.  Pt has been advised to return to the ED is CP becomes exertional, associated with diaphoresis or nausea, radiates to left jaw/arm, worsens or becomes concerning in any way. Pt appears reliable for  follow up and is agreeable to discharge.  Patient states understanding of return precautions.  Case has been discussed with and seen by Dr. Jeanell Sparrow who agrees with the above plan to discharge.    Final Clinical Impressions(s) / ED Diagnoses   Final diagnoses:  Chest wall pain  Abnormal finding on lung imaging    ED Discharge Orders        Ordered    azithromycin (ZITHROMAX) 250 MG tablet  Daily     06/10/18 1933    ibuprofen (ADVIL,MOTRIN) 400 MG tablet  Every 6 hours PRN     06/10/18 1933       Gari Crown 06/10/18 2125    Pattricia Boss, MD 06/10/18 2340

## 2020-02-25 ENCOUNTER — Ambulatory Visit: Payer: BC Managed Care – PPO | Attending: Family

## 2020-02-25 DIAGNOSIS — Z23 Encounter for immunization: Secondary | ICD-10-CM

## 2020-03-01 NOTE — Progress Notes (Signed)
   Covid-19 Vaccination Clinic  Name:  John Hampton    MRN: [id number] DOB: [birth year]  03/01/2020  [name] was observed post Covid-19 immunization for 15 minutes without incident. He was provided with Vaccine Information Sheet and instruction to access the V-Safe system.   [name] was instructed to call 911 with any severe reactions post vaccine: Marland Kitchen Difficulty breathing  . Swelling of face and throat  . A fast heartbeat  . A bad rash all over body  . Dizziness and weakness   Immunizations Administered    Name Date Dose VIS Date Route   Moderna COVID-19 Vaccine 02/25/2020  5:45 PM 0.5 mL 11/10/2019 Intramuscular   Manufacturer: Moderna   Lot: [id number]   NDC: 16384-536-46

## 2020-03-29 ENCOUNTER — Ambulatory Visit: Payer: BC Managed Care – PPO | Attending: Family

## 2020-03-29 DIAGNOSIS — Z23 Encounter for immunization: Secondary | ICD-10-CM

## 2020-03-29 NOTE — Progress Notes (Signed)
   Covid-19 Vaccination Clinic  Name:  John Hampton    MRN: [id number] DOB: 1976/08/13  03/29/2020  [name] was observed post Covid-19 immunization for 15 minutes without incident. He was provided with Vaccine Information Sheet and instruction to access the V-Safe system.   [name] was instructed to call 911 with any severe reactions post vaccine: Marland Kitchen Difficulty breathing  . Swelling of face and throat  . A fast heartbeat  . A bad rash all over body  . Dizziness and weakness   Immunizations Administered    Name Date Dose VIS Date Route   Moderna COVID-19 Vaccine 03/29/2020  3:33 PM 0.5 mL 11/2019 Intramuscular   Manufacturer: Moderna   Lot: [id number]   NDC: 29037-955-83

## 2021-06-22 ENCOUNTER — Ambulatory Visit (HOSPITAL_COMMUNITY)
Admission: EM | Admit: 2021-06-22 | Discharge: 2021-06-22 | Disposition: A | Discharge status: Home / Self Care | Payer: BC Managed Care – PPO | Attending: Urgent Care | Admitting: Urgent Care

## 2021-06-22 ENCOUNTER — Encounter (HOSPITAL_COMMUNITY): Payer: Self-pay

## 2021-06-22 ENCOUNTER — Other Ambulatory Visit: Payer: Self-pay

## 2021-06-22 DIAGNOSIS — M5412 Radiculopathy, cervical region: Secondary | ICD-10-CM

## 2021-06-22 MED ORDER — PREDNISONE 20 MG PO TABS: ORAL_TABLET | ORAL | 0 refills | Status: AC

## 2021-06-22 MED ORDER — TIZANIDINE HCL 4 MG PO TABS: 4.0000 mg | ORAL_TABLET | Freq: Three times a day (TID) | ORAL | 0 refills | Status: AC | PRN

## 2021-06-22 NOTE — ED Provider Notes (Signed)
[facility]   MRN: [id number] DOB: 05/21/1976  Subjective:   John Hampton is a 45 y.o. male presenting for 5-week history of persistent and worsening left-sided neck pain that shoots into the left shoulder and upper lateral left arm.  Denies any particular falls or trauma, weakness or numbness or tingling.  Denies history of trouble with his neck or back.  Has been using over-the-counter Tylenol for arthritis and IcyHot without relief.  He is not currently taking any medications.  Allergies  Allergen Reactions   Dilaudid [Hydromorphone Hcl] Itching    Past Medical History:  Diagnosis Date   Laceration of hand with tendon involvement 07/09/2014   extensor pollicis brevis lac. - right     Past Surgical History:  Procedure Laterality Date   HAND SURGERY Right    PERCUTANEOUS PINNING METACARPAL FRACTURE Right 07/31/2000   right 5th   TENDON REPAIR Right 07/13/2014   Procedure: REPAIR ABDUCTOR POLLICIS LONGUS AND EXTENSOR POLLICIS BREVIS TENDONS;  Surgeon: Betha Loa, MD;  Location: [facility];  Service: Orthopedics;  Laterality: Right;   WOUND EXPLORATION Right 07/13/2014   Procedure: EXPLORATION RIGHT HAND WOUNDS;  Surgeon: Betha Loa, MD;  Location: [facility];  Service: Orthopedics;  Laterality: Right;    No family history on file.  Social History   Tobacco Use   Smoking status: Every Day    Years: 20.00    Types: Cigarettes   Smokeless tobacco: Never   Tobacco comments:    6 cig./day  Substance Use Topics   Alcohol use: Yes    Comment: weekends and an additional beer during the week   Drug use: No    ROS   Objective:   Vitals: BP 125/80 (BP Location: Right Arm)   Temp 98.7 F (37.1 C) (Oral)   Resp 18   SpO2 98%   Physical Exam Constitutional:      General: He is not in acute distress.    Appearance: Normal appearance. He is well-developed and normal weight. He is not ill-appearing, toxic-appearing  or diaphoretic.  HENT:     Head: Normocephalic and atraumatic.     Right Ear: External ear normal.     Left Ear: External ear normal.     Nose: Nose normal.     Mouth/Throat:     Pharynx: Oropharynx is clear.  Eyes:     General: No scleral icterus.       Right eye: No discharge.        Left eye: No discharge.     Extraocular Movements: Extraocular movements intact.     Pupils: Pupils are equal, round, and reactive to light.  Cardiovascular:     Rate and Rhythm: Normal rate.  Pulmonary:     Effort: Pulmonary effort is normal.  Musculoskeletal:     Cervical back: Tenderness (Left lower neck over the paraspinal muscles, no midline tenderness) present. No swelling, edema, deformity, erythema, signs of trauma, lacerations, rigidity, spasms, torticollis, bony tenderness or crepitus. Pain with movement present. Normal range of motion.     Comments: Positive Spurling maneuver to the left.  Neurological:     Mental Status: He is alert and oriented to person, place, and time.     Motor: No weakness.     Coordination: Coordination normal.     Gait: Gait normal.     Deep Tendon Reflexes: Reflexes normal.  Psychiatric:        Mood and Affect: Mood normal.  Behavior: Behavior normal.        Thought Content: Thought content normal.        Judgment: Judgment normal.    Assessment and Plan :   PDMP not reviewed this encounter.  1. Cervical radiculopathy     Recommended high-dose steroid course given high suspicion for cervical radiculopathy based off of his exam.  Emphasized need for follow-up with a spine specialist for consideration of an MRI.  Use Tylenol and muscle relaxant as well in the meantime. Counseled patient on potential for adverse effects with medications prescribed/recommended today, ER and return-to-clinic precautions discussed, patient verbalized understanding.    Wallis Bamberg, PA-C 06/22/21 1949

## 2021-06-22 NOTE — ED Triage Notes (Signed)
45 year old [place] American male presents today with complaints left side neck and shoulder pain that started about 5 weeks ago. 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 Patient states he has tried OTC Tylenol Arthritis and Icy Hot Pro cream with no relief.  Patient denies: chest pain, shortness of breath, fever.

## 2021-07-02 ENCOUNTER — Other Ambulatory Visit: Payer: Self-pay

## 2021-07-02 ENCOUNTER — Encounter (HOSPITAL_COMMUNITY): Payer: Self-pay

## 2021-07-02 ENCOUNTER — Emergency Department (HOSPITAL_COMMUNITY)
Admission: EM | Admit: 2021-07-02 | Discharge: 2021-07-02 | Disposition: A | Discharge status: Home / Self Care | Payer: BC Managed Care – PPO | Attending: Emergency Medicine | Admitting: Emergency Medicine

## 2021-07-02 DIAGNOSIS — M542 Cervicalgia: Secondary | ICD-10-CM

## 2021-07-02 DIAGNOSIS — F17210 Nicotine dependence, cigarettes, uncomplicated: Secondary | ICD-10-CM | POA: Diagnosis not present

## 2021-07-02 DIAGNOSIS — R202 Paresthesia of skin: Secondary | ICD-10-CM | POA: Diagnosis not present

## 2021-07-02 DIAGNOSIS — R030 Elevated blood-pressure reading, without diagnosis of hypertension: Secondary | ICD-10-CM | POA: Insufficient documentation

## 2021-07-02 HISTORY — DX: Radiculopathy, cervical region: M54.12

## 2021-07-02 MED ORDER — METHOCARBAMOL 500 MG PO TABS: 500.0000 mg | ORAL_TABLET | Freq: Three times a day (TID) | ORAL | 0 refills | Status: AC | PRN

## 2021-07-02 MED ORDER — LIDOCAINE 5 % EX PTCH: 1.0000 | MEDICATED_PATCH | Freq: Every day | CUTANEOUS | 0 refills | Status: AC | PRN

## 2021-07-02 MED ORDER — HYDROCODONE-ACETAMINOPHEN 5-325 MG PO TABS
1.0000 | ORAL_TABLET | Freq: Once | ORAL | Status: AC
  Administered 2021-07-02: 1 via ORAL
  Filled 2021-07-02: qty 1

## 2021-07-02 MED ORDER — KETOROLAC TROMETHAMINE 30 MG/ML IJ SOLN
30.0000 mg | Freq: Once | INTRAMUSCULAR | Status: AC
Start: 2021-07-02 — End: 2021-07-02
  Administered 2021-07-02: 30 mg via INTRAMUSCULAR
  Filled 2021-07-02: qty 1

## 2021-07-02 NOTE — Discharge Instructions (Addendum)
You were seen in the emergency department today for worsening neck and upper arm pain.  Please continue the steroids prescribed by urgent care.  We are sending you home with Robaxin which is a muscle relaxant, please take 1 tablet every 8 hours as needed for pain.  Do not drive or operate heavy machinery when taking this medicine as it can make you sleepy.  Do not take other sedating medications or drink alcohol with this medicine.  We are also sending you home with Lidoderm patches, please apply 1 patch to area with significant pain once per day.  Remove and discard patch within 12 hours of application.  We have prescribed you new medication(s) today. Discuss the medications prescribed today with your pharmacist as they can have adverse effects and interactions with your other medicines including over the counter and prescribed medications. Seek medical evaluation if you start to experience new or abnormal symptoms after taking one of these medicines, seek care immediately if you start to experience difficulty breathing, feeling of your throat closing, facial swelling, or rash as these could be indications of a more serious allergic reaction  Please follow-up with [facility] to have your MRI scheduled.  Return to the emergency department for new or worsening symptoms including but not limited to new or worsening pain, weakness, complete numbness, inability to grip things, fever, trouble walking, or any other concerns.

## 2021-07-02 NOTE — ED Provider Notes (Signed)
[facility] DEPT Provider Note   CSN: [id number] Arrival date & time: 07/02/21  1356     History Chief Complaint  Patient presents with   Neck Pain    CARON TARDIF is a 45 y.o. male with a history of cervical radiculopathy who presents to the emergency department with complaints of left-sided neck and upper extremity pain which have been occurring for the past 5 weeks.  Patient states the pain is to the left side of his neck, it radiates into the left upper arm with intermittent left upper extremity paresthesias.  Worse with certain movements.  No alleviating factors.  He is currently taking steroids that were prescribed by urgent care without much relief.  He was seen by [place] neurosurgery 2 days prior, plan is to set up outpatient MRI.  He states he is here because he needs something to help with the discomfort.  Denies weakness, complete numbness, chest pain, dyspnea, fever, or history of IVDU.  HPI     Past Medical History:  Diagnosis Date   Cervical radiculopathy    Laceration of hand with tendon involvement 84/66/5993   extensor pollicis brevis lac. - right    There are no problems to display for this patient.   Past Surgical History:  Procedure Laterality Date   HAND SURGERY Right    PERCUTANEOUS PINNING METACARPAL FRACTURE Right 07/31/2000   right 5th   TENDON REPAIR Right 07/13/2014   Procedure: REPAIR ABDUCTOR POLLICIS LONGUS AND EXTENSOR POLLICIS BREVIS TENDONS;  Surgeon: Leanora Cover, MD;  Location: [place];  Service: Orthopedics;  Laterality: Right;   WOUND EXPLORATION Right 07/13/2014   Procedure: EXPLORATION RIGHT HAND WOUNDS;  Surgeon: Leanora Cover, MD;  Location: [place];  Service: Orthopedics;  Laterality: Right;       History reviewed. No pertinent family history.  Social History   Tobacco Use   Smoking status: Every Day    Years: 20.00    Types: Cigarettes   Smokeless tobacco:  Never   Tobacco comments:    6 cig./day  Substance Use Topics   Alcohol use: Yes    Comment: weekends and an additional beer during the week   Drug use: No    Home Medications Prior to Admission medications   Medication Sig Start Date End Date Taking? Authorizing Provider  amoxicillin-clavulanate (AUGMENTIN) 875-125 MG tablet Take 1 tablet every 12 (twelve) hours by mouth. Patient not taking: No sig reported 10/19/17   Caccavale, Sophia, PA-C  azithromycin (ZITHROMAX) 250 MG tablet Take 1 tablet (250 mg total) by mouth daily. Take one tablet by mouth daily for the next 4 days. 06/10/18   Nuala Alpha A, PA-C  chlorhexidine gluconate, MEDLINE KIT, (PERIDEX) 0.12 % solution Use as directed 15 mLs 2 (two) times daily in the mouth or throat. SWISH AND SPIT for 30 seconds twice daily (morning and evening) after toothbrushing Patient not taking: No sig reported 10/23/17   Maczis, Barth Kirks, PA-C  HYDROcodone-acetaminophen (NORCO) 5-325 MG per tablet 1-2 tabs po q6 hours prn pain Patient not taking: No sig reported 07/13/14   Leanora Cover, MD  ibuprofen (ADVIL,MOTRIN) 400 MG tablet Take 1 tablet (400 mg total) by mouth every 6 (six) hours as needed. 06/10/18   Nuala Alpha A, PA-C  Olopatadine HCl 0.2 % SOLN Apply 1 drop to eye daily. Patient not taking: No sig reported 08/19/17   Tasia Catchings, Amy V, PA-C  predniSONE (DELTASONE) 20 MG tablet Day 1-5: Take 3  tablets daily. Day 6-10: Take 2 tablets daily. Day 11-15: Take 1 tablet daily. Take tablets with breakfast. 06/22/21   Jaynee Eagles, PA-C  sulfamethoxazole-trimethoprim (BACTRIM DS) 800-160 MG per tablet Take 1 tablet by mouth 2 (two) times daily. Patient not taking: No sig reported 07/10/14   Junius Creamer, NP  tiZANidine (ZANAFLEX) 4 MG tablet Take 1 tablet (4 mg total) by mouth every 8 (eight) hours as needed. 06/22/21   Jaynee Eagles, PA-C    Allergies    Dilaudid [hydromorphone hcl]  Review of Systems   Review of Systems  Constitutional:  Negative  for chills, fever and unexpected weight change.  Gastrointestinal:  Negative for abdominal pain, nausea and vomiting.  Genitourinary:  Negative for dysuria.  Musculoskeletal:  Positive for myalgias and neck pain.  Neurological:  Negative for weakness and numbness.       Negative for saddle anesthesia or bowel/bladder incontinence.   All other systems reviewed and are negative.  Physical Exam Updated Vital Signs BP (!) 138/91 (BP Location: Right Arm)   Pulse 99   Temp 97.8 F (36.6 C) (Oral)   Resp 18   SpO2 99%   Physical Exam Vitals and nursing note reviewed.  Constitutional:      General: He is not in acute distress.    Appearance: Normal appearance. He is not ill-appearing or toxic-appearing.  HENT:     Head: Normocephalic and atraumatic.  Neck:     Comments: No midline tenderness.  Some mild left paraspinal muscle tenderness. Cardiovascular:     Rate and Rhythm: Normal rate and regular rhythm.     Pulses:          Radial pulses are 2+ on the right side and 2+ on the left side.  Pulmonary:     Effort: No respiratory distress.     Breath sounds: Normal breath sounds.  Musculoskeletal:     Cervical back: Normal range of motion and neck supple.     Comments: Upper extremities: No obvious deformity, appreciable swelling, edema, erythema, ecchymosis, warmth, or open wounds. Patient has intact AROM throughout.  Mildly tender to left upper arm posteriorly.  Compartments are soft.  Skin:    General: Skin is warm and dry.     Capillary Refill: Capillary refill takes less than 2 seconds.  Neurological:     Mental Status: He is alert.     Comments: Alert. Clear speech. Sensation grossly intact to bilateral upper extremities. 5/5 symmetric grip strength and strength with elbow flexion/extension and shoulder flexion/extension.. Ambulatory.   Psychiatric:        Mood and Affect: Mood normal.        Behavior: Behavior normal.    ED Results / Procedures / Treatments   Labs (all  labs ordered are listed, but only abnormal results are displayed) Labs Reviewed - No data to display  EKG None  Radiology No results found.  Procedures Procedures   Medications Ordered in ED Medications  HYDROcodone-acetaminophen (NORCO/VICODIN) 5-325 MG per tablet 1 tablet (has no administration in time range)  ketorolac (TORADOL) 30 MG/ML injection 30 mg (has no administration in time range)    ED Course  I have reviewed the triage vital signs and the nursing notes.  Pertinent labs & imaging results that were available during my care of the patient were reviewed by me and considered in my medical decision making (see chart for details).    MDM Rules/Calculators/A&P  Patient presents to the ED with complaints of left-sided neck/upper extremity pain for the past 5 weeks.  Nontoxic, resting comfortably, vitals with mildly elevated blood pressure, doubt hypertensive emergency.  Additional history obtained:  Additional history obtained from chart review & nursing note review.  Seen at urgent care for similar in 06/22/2021.  ED Course:  Afebrile, no history of IVDU, do not suspect epidural abscess.  No overlying skin changes to suggest shingles or cellulitis.  No trauma or midline tenderness to the neck or focal bony tenderness to suggest fracture or dislocation.  Patient does have some intermittent paresthesias, however sensation is grossly intact and there is no weakness on exam, do not suspect significant acute cord compression, he is being scheduled for an outpatient MRI which I feel be beneficial, do not feel that he needs this emergently today.  We will provide further pain control.  Follow-up with outpatient neurosurgery.  I discussed treatment plan, need for follow-up, and return precautions with the patient. Provided opportunity for questions, patient confirmed understanding and is in agreement with plan.    Portions of this note were generated  with [employer]. Dictation errors may occur despite best attempts at proofreading.  Final Clinical Impression(s) / ED Diagnoses Final diagnoses:  Neck pain    Rx / DC Orders ED Discharge Orders          Ordered    methocarbamol (ROBAXIN) 500 MG tablet  Every 8 hours PRN        07/02/21 1525    lidocaine (LIDODERM) 5 %  Daily PRN        07/02/21 1525             Joven Mom, [place], PA-C 07/02/21 1526    Regan Lemming, MD 07/02/21 1647

## 2021-07-02 NOTE — ED Triage Notes (Signed)
Pt c/o L side neck pain shooting into L arm x 5 week.  Pain score 10/10.  Pt has been seen by UC and [facility] recently for same.  Sts the UC prescribed a medication but it has not been relieving pain.  Sts he is supposed to be getting a MRI scheduled by [facility].

## 2021-07-11 ENCOUNTER — Ambulatory Visit: Payer: BC Managed Care – PPO

## 2021-07-15 ENCOUNTER — Ambulatory Visit: Payer: BC Managed Care – PPO

## 2021-07-22 ENCOUNTER — Ambulatory Visit: Payer: BC Managed Care – PPO | Attending: Neurosurgery

## 2021-07-22 ENCOUNTER — Other Ambulatory Visit: Payer: Self-pay

## 2021-07-22 DIAGNOSIS — M5412 Radiculopathy, cervical region: Secondary | ICD-10-CM | POA: Diagnosis present

## 2021-07-22 DIAGNOSIS — R29898 Other symptoms and signs involving the musculoskeletal system: Secondary | ICD-10-CM | POA: Insufficient documentation

## 2021-07-23 NOTE — Patient Instructions (Signed)
See plan

## 2021-07-23 NOTE — Therapy (Signed)
[facility] Outpatient Rehabilitation [facility] [address] [place], [place], [address] Phone: [phone]   Fax:  [phone]  Physical Therapy Evaluation  Patient Details  Name: John Hampton MRN: [id number] Date of Birth: 1976/03/09 Referring Provider (PT): Lisbeth Renshaw, MD   Encounter Date: 07/22/2021    Past Medical History:  Diagnosis Date   Cervical radiculopathy    Laceration of hand with tendon involvement 07/09/2014   extensor pollicis brevis lac. - right    Past Surgical History:  Procedure Laterality Date   HAND SURGERY Right    PERCUTANEOUS PINNING METACARPAL FRACTURE Right 07/31/2000   right 5th   TENDON REPAIR Right 07/13/2014   Procedure: REPAIR ABDUCTOR POLLICIS LONGUS AND EXTENSOR POLLICIS BREVIS TENDONS;  Surgeon: Betha Loa, MD;  Location: [facility];  Service: Orthopedics;  Laterality: Right;   WOUND EXPLORATION Right 07/13/2014   Procedure: EXPLORATION RIGHT HAND WOUNDS;  Surgeon: Betha Loa, MD;  Location: [facility];  Service: Orthopedics;  Laterality: Right;    There were no vitals filed for this visit.    Subjective Assessment - 07/23/21 2324     Subjective Pt reports experiencing L neck pain on 2 different days which resolved, but then the pain returned and was significant and did not resolve. Pt reports he had to hold his head tilted to the R when the pain initially occured. Pt reports the pain started on 05/18/21 and it has gradually improved, but has not resolved. Pt is working full time as a Science writer, but has to be careful to minimize neck pain. Pain is located at the L base of his neck and lateral shoulder.    Diagnostic tests None    Patient Stated Goals For the pain to go away    Currently in Pain? Yes    Pain Score 6    6-9/10 pain range   Pain Location Neck    Pain Orientation Left    Pain Descriptors / Indicators Aching;Sharp    Pain Type Chronic pain    Pain Onset More  than a month ago    Pain Frequency Constant    Aggravating Factors  sleeping, work    Pain Relieving Factors nothing specific has seemed to help, it has just gradually improved                [facility] PT Assessment - 07/23/21 0001       Assessment   Medical Diagnosis Radiculopathy, cervical region    Referring Provider (PT) Lisbeth Renshaw, MD    Onset Date/Surgical Date 05/18/21    Hand Dominance Right    Prior Therapy No      Precautions   Precautions None      Restrictions   Weight Bearing Restrictions No      Balance Screen   Has the patient fallen in the past 6 months No      Home Environment   Living Environment Private residence    Living Arrangements Spouse/significant other;Children    Type of Home House    Home Access Stairs to enter    Entrance Stairs-Number of Steps 4    Entrance Stairs-Rails Right    Home Layout Two level    Alternate Level Stairs-Number of Steps 13    Alternate Level Stairs-Rails Right      Prior Function   Level of Independence Independent    Vocation Full time employment    Community education officer at [employer]  Cognition   Overall Cognitive Status Within Functional Limits for tasks assessed      Observation/Other Assessments   Focus on Therapeutic Outcomes (FOTO)  50% limited function      Sensation   Light Touch Appears Intact      Posture/Postural Control   Posture/Postural Control Postural limitations    Postural Limitations Forward head;Decreased thoracic kyphosis;Increased thoracic kyphosis      Deep Tendon Reflexes   DTR Assessment Site Biceps;Brachioradialis;Triceps    Biceps DTR 2+    Brachioradialis DTR 2+    Triceps DTR 2+      ROM / Strength   AROM / PROM / Strength AROM;Strength      AROM   AROM Assessment Site Cervical    Cervical Flexion 45d, no pain    Cervical Extension 30d,L cervical pain    Cervical - Right Side Bend 36d,L cervical pain    Cervical - Left Side Bend 37d,L cervical  pain    Cervical - Right Rotation 60d,L cervical pain    Cervical - Left Rotation 60d,L cervical pain      Strength   Overall Strength Comments UE myotoma screen is negative      Palpation   Palpation comment TTP distal L cervical and upper trap                        Objective measurements completed on examination: See above findings.               PT Education - 07/23/21 2229     Education Details Eval findings, POC, HEP    Person(s) Educated Patient    Methods Explanation;Demonstration;Tactile cues;Verbal cues;Handout    Comprehension Verbalized understanding;Returned demonstration;Verbal cues required;Tactile cues required              PT Short Term Goals - 07/23/21 2312       PT SHORT TERM GOAL #1   Title Pt will be Ind with an initial HEP    Status New    Target Date 08/27/21      PT SHORT TERM GOAL #2   Title Pt will be able to demonstrated proper sitting posture               PT Long Term Goals - 07/23/21 2315       PT LONG TERM GOAL #1   Title 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    Baseline 30d    Status New    Target Date 09/09/21      PT LONG TERM GOAL #3   Title Pt's FOTO functional score will increase to 69%    Baseline 50%    Status New    Target Date 09/09/21      PT LONG TERM GOAL #4   Title Pt will be Ind in a final HEP to maintain or improve achieved LOF.    Status New    Target Date 09/09/21                    Plan - 07/23/21 2326     Clinical Impression Statement Pt presents  with chronic neck pain which has slowly improved since 6/9, but it is still moderate in intensity and improvement has slowed. Neck extension ROM is limited and provokes pain. See HEP  section for treatment protocol the pt responded well to. Following completion of cervical retraction c side bending L 10x, the pt's L distal cervical and lateral shoulder pain resolved. Pt was not able to progress further with ext. ROM due to pain. Pt is to attempt to gradually increase ext motion as tolerated. Pt is to stop movements which peripheralize L arm pain. Pt will benefit for skilled PT 2w2, 1w4 to reduce cervical pain, improve cervical ROM, and fucntion involving the neck.    Personal Factors and Comorbidities Time since onset of injury/illness/exacerbation    Examination-Activity Limitations Sleep;Reach Overhead;Lift    Examination-Participation Restrictions Occupation    Stability/Clinical Decision Making Stable/Uncomplicated    Clinical Decision Making Low    Rehab Potential Good    PT Frequency 2x / week   then 1w4   PT Duration 6 weeks    PT Treatment/Interventions ADLs/Self Care Home Management;Electrical Stimulation;Cryotherapy;Iontophoresis 4mg /ml Dexamethasone;Moist Heat;Traction;Therapeutic exercise;Patient/family education;Manual techniques;Dry needling;Passive range of motion;Taping;Spinal Manipulations    PT Next Visit Plan Assess response to HEP. Review HEP    PT Home Exercise Plan cervical retraction 10x f/b crev retract c L side bending 10x, f/b cerv retract c ext to edge of pain 10x- Pt is to complete 4 to 6x/day    Consulted and Agree with Plan of Care Patient             Patient will benefit from skilled therapeutic intervention in order to improve the following deficits and impairments:  Decreased range of motion, Pain, Postural dysfunction  Visit Diagnosis: Radiculopathy, cervical region  Decreased ROM of neck     Problem List There are no problems to display for this patient.   MS, PT 07/23/21 11:27 PM   [facility] Outpatient Rehabilitation [facility] [address] [place], [place], [place] Phone: [phone]   Fax:   [phone]  Name: John Hampton MRN: Anabel Halon Date of Birth: February 08, 1976

## 2021-07-29 ENCOUNTER — Encounter: Payer: Self-pay | Admitting: Physical Therapy

## 2021-07-29 ENCOUNTER — Ambulatory Visit: Payer: BC Managed Care – PPO | Admitting: Physical Therapy

## 2021-07-29 ENCOUNTER — Other Ambulatory Visit: Payer: Self-pay

## 2021-07-29 DIAGNOSIS — M5412 Radiculopathy, cervical region: Secondary | ICD-10-CM

## 2021-07-29 DIAGNOSIS — R29898 Other symptoms and signs involving the musculoskeletal system: Secondary | ICD-10-CM

## 2021-07-29 NOTE — Patient Instructions (Addendum)

## 2021-07-29 NOTE — Therapy (Signed)
[facility] Outpatient Rehabilitation [facility] [address] [place], [place], [address] Phone: [phone]   Fax:  [phone]  Physical Therapy Treatment  Patient Details  Name: John Hampton MRN: [id number] Date of Birth: 1976/03/27 Referring Provider (PT): John Renshaw, MD   Encounter Date: 07/29/2021   PT End of Session - 07/29/21 1056     Visit Number 2    Number of Visits 9    Date for PT Re-Evaluation 09/09/21    Authorization Type [employer] PPO    PT Start Time 1055    PT Stop Time 1138    PT Time Calculation (min) 43 min    Activity Tolerance Patient tolerated treatment well;Patient limited by pain    Behavior During Therapy [facility] for tasks assessed/performed             Past Medical History:  Diagnosis Date   Cervical radiculopathy    Laceration of hand with tendon involvement 07/09/2014   extensor pollicis brevis lac. - right    Past Surgical History:  Procedure Laterality Date   HAND SURGERY Right    PERCUTANEOUS PINNING METACARPAL FRACTURE Right 07/31/2000   right 5th   TENDON REPAIR Right 07/13/2014   Procedure: REPAIR ABDUCTOR POLLICIS LONGUS AND EXTENSOR POLLICIS BREVIS TENDONS;  Surgeon: John Loa, MD;  Location: [facility];  Service: Orthopedics;  Laterality: Right;   WOUND EXPLORATION Right 07/13/2014   Procedure: EXPLORATION RIGHT HAND WOUNDS;  Surgeon: John Loa, MD;  Location: [facility];  Service: Orthopedics;  Laterality: Right;    There were no vitals filed for this visit.   Subjective Assessment - 07/29/21 1057     Subjective "I am whole lot better. I am feel like the the pain is much better in the L arm.    Currently in Pain? Yes    Pain Score 5     Pain Orientation Left    Pain Descriptors / Indicators Aching    Pain Type Chronic pain    Pain Onset More than a month ago    Pain Frequency Intermittent    Aggravating Factors  sleeping                                        PT Education - 07/29/21 1139     Education Details Reviewed HEP and updated today. Reviewed posture and lfiting mechanics.    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    Baseline 30d    Status New    Target Date 09/09/21      PT LONG TERM GOAL #3   Title Pt's FOTO functional score will increase to 69%    Baseline 50%    Status New    Target Date 09/09/21      PT LONG TERM GOAL #4   Title Pt will be Ind in a final HEP to maintain or improve achieved LOF.    Status New    Target Date 09/09/21               [facility] Adult PT Treatment/Exercise:  Self Care: Posture education with associated handout  Therapeutic Exercise: Upper trap stretch PNF contract/ relax 2 x 30 sec  Levator scapulae stretch 2 x 30 with manual overpressure Seated chin tuck 1 x 20 holding 5 sec UBE L3 x FWD/ BWD x 2 min  Double scapular retraction with ER 2 x 15 with GTB  Manual Therapy:  MTPR along the L upper trap/ levator scapulae      Plan - 07/29/21 1141     Clinical Impression Statement Mr John Hampton reports improvement in neck/ shoulder pain but continues to report pain in the L shoulder. he responded well to [facility] for  the L upper trap/ levator scapulae followed with stretching. Reviewed posture and lifting mechanics and provided associated handout, discussed focusing on activity rather than noting continues soreness / stiffness.    PT Treatment/Interventions ADLs/Self Care Home Management;Electrical Stimulation;Cryotherapy;Iontophoresis 4mg /ml Dexamethasone;Moist Heat;Traction;Therapeutic exercise;Patient/family education;Manual techniques;Dry needling;Passive range of motion;Taping;Spinal Manipulations    PT Next Visit Plan Assess response to HEP, lifting mechanics / posture strengthening, discuss DN? posterior shoulder strengthening.    PT Home Exercise Plan cervical retraction 10x f/b crev retract c L side bending 10x, f/b cerv retract c ext to edge of pain 10x- Pt is to complete 4 to 6x/day - [id number]    Consulted and Agree with Plan of Care Patient             Patient will benefit from skilled therapeutic intervention in order to improve the following deficits and impairments:  Decreased range of motion, Pain, Postural dysfunction  Visit Diagnosis: Radiculopathy, cervical region  Decreased ROM of neck     Problem List There are no problems to display for this patient.  PT, DPT, LAT, ATC  07/29/21  11:45 AM      [facility] [address] [place], [place], [place] Phone: [phone]   Fax:  [phone]  Name: John Hampton MRN: Anabel Halon Date of Birth: [birth year]

## 2021-08-05 ENCOUNTER — Telehealth: Payer: Self-pay | Admitting: Physical Therapy

## 2021-08-05 ENCOUNTER — Ambulatory Visit: Payer: BC Managed Care – PPO | Admitting: Physical Therapy

## 2021-08-05 NOTE — Telephone Encounter (Signed)
Called pt regarding missed appointment today. He stated he did call and left a message at 9am before his appointment time. I notified him of his next appointment day/ time and he stated he plans to attend.  Further follow up, the clinic VM had not been checked and he did leave a VM about cancelling todays appointment.   Denisse Whitenack PT, DPT, LAT, ATC  08/05/21  11:10 AM

## 2021-08-19 ENCOUNTER — Other Ambulatory Visit: Payer: Self-pay

## 2021-08-19 ENCOUNTER — Ambulatory Visit: Payer: BC Managed Care – PPO | Attending: Neurosurgery

## 2021-08-19 DIAGNOSIS — M5412 Radiculopathy, cervical region: Secondary | ICD-10-CM

## 2021-08-19 DIAGNOSIS — R29898 Other symptoms and signs involving the musculoskeletal system: Secondary | ICD-10-CM

## 2021-08-19 NOTE — Therapy (Signed)
[facility] [address] [place], [place], [address] Phone: [phone]   Fax:  [phone]  Physical Therapy Treatment  Patient Details  Name: John Hampton MRN: [id number] Date of Birth: 08/07/1976 Referring Provider (PT): Lisbeth Renshaw, MD   Encounter Date: 08/19/2021   PT End of Session - 08/19/21 1224     Visit Number 3    Number of Visits 9    Date for PT Re-Evaluation 09/09/21    Authorization Type [employer] PPO    Progress Note Due on Visit 10    PT Start Time 1110    PT Stop Time 1155    PT Time Calculation (min) 45 min    Activity Tolerance Patient tolerated treatment well;Patient limited by pain    Behavior During Therapy [facility] for tasks assessed/performed             Past Medical History:  Diagnosis Date   Cervical radiculopathy    Laceration of hand with tendon involvement 07/09/2014   extensor pollicis brevis lac. - right    Past Surgical History:  Procedure Laterality Date   HAND SURGERY Right    PERCUTANEOUS PINNING METACARPAL FRACTURE Right 07/31/2000   right 5th   TENDON REPAIR Right 07/13/2014   Procedure: REPAIR ABDUCTOR POLLICIS LONGUS AND EXTENSOR POLLICIS BREVIS TENDONS;  Surgeon: Betha Loa, MD;  Location: [facility];  Service: Orthopedics;  Laterality: Right;   WOUND EXPLORATION Right 07/13/2014   Procedure: EXPLORATION RIGHT HAND WOUNDS;  Surgeon: Betha Loa, MD;  Location: [facility];  Service: Orthopedics;  Laterality: Right;    There were no vitals filed for this visit.   Subjective Assessment - 08/19/21 1110     Subjective Continuing to do much better. Pain comes and goes and is in the L shoulder. He experiences pain c combination cervical ext/flexion.    Currently in Pain? Yes    Pain Score 4     Pain Location Neck    Pain Orientation Left    Pain Descriptors / Indicators Aching    Pain Type Chronic pain                                [facility] Adult PT Treatment/Exercise - 08/19/21 0001       Self-Care   Self-Care Other Self-Care Comments    Other Self-Care Comments  HEP      Exercises   Exercises Neck      Neck Exercises: Seated   Neck Retraction 10 reps;3 secs   2 sets     Neck Exercises: Stretches   Upper Trapezius Stretch Right;20 seconds;2 reps    Levator Stretch Right;2 reps;20 seconds   pt over pressure   Other Neck Stretches L UE median nerve tensoner stretch 5x, 3 sec      Manual Therapy   Manual Therapy Manual Traction    Manual Traction Grade 3 axial cervical traction.                     PT Education - 08/19/21 1227     Education Details HEP update    Person(s) Educated Patient    Methods Explanation;Demonstration;Verbal cues;Tactile cues;Handout    Comprehension Verbalized understanding;Returned demonstration;Verbal cues required;Tactile cues required              PT Short Term Goals - 08/19/21 1230       PT SHORT  TERM GOAL #1   Title Pt will be Ind with an initial HEP    Status Achieved    Target Date 08/27/21      PT SHORT TERM GOAL #2   Title Pt will be able to demonstrated proper sitting posture    Status Achieved    Target Date 08/19/21               PT Long Term Goals - 07/23/21 2315       PT LONG TERM GOAL #1   Title 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    Baseline 30d    Status New    Target Date 09/09/21      PT LONG TERM GOAL #3   Title Pt's FOTO functional score will increase to 69%    Baseline 50%    Status New    Target Date 09/09/21      PT LONG TERM GOAL #4   Title Pt will be Ind in a final HEP to maintain or improve achieved LOF.    Status New    Target Date  09/09/21                   Plan - 08/19/21 1228     Clinical Impression Statement Pt continues to reportimprovement in L upper and lateral shoulder pain. Spurlings is positive L. HEP was updated c upper trap and levator stretches only for the L with cervical movements R. Median nerve tensioner stretch was added. In good posture and following completing cervical retractions, pt reports L lateral shoulder pain is resolved. Manual cervical traction was also provided again with L lateral shoulder pain being resolved. Pt is responding positively to present course of PT care.    Personal Factors and Comorbidities Time since onset of injury/illness/exacerbation    Examination-Activity Limitations Sleep;Reach Overhead;Lift    Examination-Participation Restrictions Occupation    Stability/Clinical Decision Making Stable/Uncomplicated    Clinical Decision Making Low    Rehab Potential Good    PT Frequency 2x / week    PT Duration 6 weeks    PT Treatment/Interventions ADLs/Self Care Home Management;Electrical Stimulation;Cryotherapy;Iontophoresis 4mg /ml Dexamethasone;Moist Heat;Traction;Therapeutic exercise;Patient/family education;Manual techniques;Dry needling;Passive range of motion;Taping;Spinal Manipulations    PT Next Visit Plan Assess response to HEP, lifting mechanics / posture strengthening, discuss DN? posterior shoulder strengthening.    PT Home Exercise Plan cervical retraction 10x f/b crev retract c L side bending 10x, f/b cerv retract c ext to edge of pain 10x- Pt is to complete 4 to 6x/day - [id number]    Consulted and Agree with Plan of Care Patient             Patient will benefit from skilled therapeutic intervention in order to improve the following deficits and impairments:  Decreased range of motion, Pain, Postural dysfunction  Visit Diagnosis: Radiculopathy, cervical region  Decreased ROM of neck     Problem List There are no problems to display for this  patient.   , PT 08/19/2021, 12:41 PM  [facility] [address] [place], [place], [place] Phone: [phone]   Fax:  [phone]  Name: John Hampton MRN: [id number] Date of Birth: November 02, 1976

## 2021-08-26 ENCOUNTER — Other Ambulatory Visit: Payer: Self-pay

## 2021-08-26 ENCOUNTER — Ambulatory Visit: Payer: BC Managed Care – PPO

## 2021-08-26 DIAGNOSIS — M5412 Radiculopathy, cervical region: Secondary | ICD-10-CM

## 2021-08-26 DIAGNOSIS — R29898 Other symptoms and signs involving the musculoskeletal system: Secondary | ICD-10-CM

## 2021-08-26 NOTE — Therapy (Signed)
[place], [place], [address] Phone: [phone]   Fax:  [phone]  Physical Therapy Treatment  Patient Details  Name: John Hampton MRN: [id number] Date of Birth: 10-Sep-1976 Referring Provider (PT): Consuella Lose, MD   Encounter Date: 08/26/2021   PT End of Session - 08/26/21 1350     Visit Number 4    Number of Visits 9    Date for PT Re-Evaluation 09/09/21    Authorization Type [employer]    PT Start Time 1117    PT Stop Time 1200    PT Time Calculation (min) 43 min    Activity Tolerance Patient tolerated treatment well    Behavior During Therapy [facility] for tasks assessed/performed             Past Medical History:  Diagnosis Date   Cervical radiculopathy    Laceration of hand with tendon involvement 58/52/7782   extensor pollicis brevis lac. - right    Past Surgical History:  Procedure Laterality Date   HAND SURGERY Right    PERCUTANEOUS PINNING METACARPAL FRACTURE Right 07/31/2000   right 5th   TENDON REPAIR Right 07/13/2014   Procedure: REPAIR ABDUCTOR POLLICIS LONGUS AND EXTENSOR POLLICIS BREVIS TENDONS;  Surgeon: Leanora Cover, MD;  Location: [facility];  Service: Orthopedics;  Laterality: Right;   WOUND EXPLORATION Right 07/13/2014   Procedure: EXPLORATION RIGHT HAND WOUNDS;  Surgeon: Leanora Cover, MD;  Location: [facility];  Service: Orthopedics;  Laterality: Right;    There were no vitals filed for this visit.   Subjective Assessment - 08/26/21 1342     Subjective Pt reports his neck pain is continuing to improve, est. approx 75%. Pt notes he is mostly pain free c occasional pai c cervical ext. or increase physical demand.    Patient Stated Goals For the pain to go away    Currently in Pain? No/denies    Pain Score --   pain raange 0-6/10   Pain Location Neck    Pain Orientation Left    Pain Descriptors / Indicators Aching     Pain Type Chronic pain    Pain Onset More than a month ago    Pain Frequency Intermittent                               [facility] Adult PT Treatment/Exercise - 08/26/21 0001       Exercises   Exercises Neck      Neck Exercises: Theraband   Shoulder External Rotation 10 reps;Green   2 sets     Neck Exercises: Seated   Neck Retraction 10 reps;3 secs   2 sets     Neck Exercises: Stretches   Upper Trapezius Stretch Right;20 seconds;2 reps    Levator Stretch Right;2 reps;20 seconds   pt over pressure   Other Neck Stretches L UE median nerve tensoner stretch 5x, 3 sec                     PT Education - 08/26/21 1349     Education Details Reviewed final HEP and the results of FOTO    Person(s) Educated Patient    Methods Explanation;Handout    Comprehension Verbalized understanding;Returned demonstration              PT Short Term Goals - 08/19/21 1230       PT  SHORT TERM GOAL #1   Title Pt will be Ind with an initial HEP    Status Achieved    Target Date 08/27/21      PT SHORT TERM GOAL #2   Title Pt will be able to demonstrated proper sitting posture    Status Achieved    Target Date 08/19/21               PT Long Term Goals - 08/26/21 1142       PT LONG TERM GOAL #1   Title 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 08/26/21: Pain range 0-6/10 occasionally c looking up or heavy activity    Baseline 6-9/10    Status Partially Met    Target Date 08/26/21      PT LONG TERM GOAL #2   Title Cervical ext will increase to 45d with only min pain at end range.08/26/21: 50d c min to mod pain at end range    Baseline 30d    Status Partially Met    Target Date 08/26/21      PT LONG TERM GOAL #3   Title Pt's FOTO functional score will increase to 69% after 11 visits; 08/26/21 61% after 4 visits    Baseline 50%    Status Achieved    Target Date 08/26/21      PT  LONG TERM GOAL #4   Title Pt will be Ind in a final HEP to maintain or improve achieved LOF.    Status Achieved    Target Date 08/26/21                   Plan - 08/26/21 1351     Clinical Impression Statement Completed reassessment of goals. Reviewed HEP with pt completing the exs correctly. Overall, pt has made good progress in PT re: pain, cervical ROM and function. pt is still experincing pain occasionally at a decreased level. With progressive improvemnt to date, pt was inagreement to DC to his HEP    Personal Factors and Comorbidities Time since onset of injury/illness/exacerbation    Examination-Activity Limitations Sleep;Reach Overhead;Lift    Examination-Participation Restrictions Occupation    Stability/Clinical Decision Making Stable/Uncomplicated    Clinical Decision Making Low    Rehab Potential Good    PT Frequency 2x / week    PT Duration 6 weeks    PT Treatment/Interventions ADLs/Self Care Home Management;Electrical Stimulation;Cryotherapy;Iontophoresis 89m/ml Dexamethasone;Moist Heat;Traction;Therapeutic exercise;Patient/family education;Manual techniques;Dry needling;Passive range of motion;Taping;Spinal Manipulations    PT Home Exercise Plan cervical retraction 10x f/b crev retract c L side bending 10x, f/b cerv retract c ext to edge of pain 10x- Pt is to complete 4 to 6x/day - [id number]    Consulted and Agree with Plan of Care Patient             Patient will benefit from skilled therapeutic intervention in order to improve the following deficits and impairments:  Decreased range of motion, Pain, Postural dysfunction  Visit Diagnosis: Decreased ROM of neck  Radiculopathy, cervical region     Problem List There are no problems to display for this patient.   AGar Ponto PT 08/26/2021, 2:07 PM  [facility] [address]Phone: [phone]  Fax:  [phone] Name:  John WEIDAMRN: [id number] of Birth: 1March 07, 1977 PHYSICAL THERAPY DISCHARGE SUMMARY  Visits from  Start of Care: 4  Current functional level related to goals / functional outcomes: See above   Remaining deficits: See above   Education / Equipment: HEP   Patient agrees to discharge. Patient goals were partially met. Patient is being discharged due to being pleased with the current functional level.  Marland Kitchen

## 2024-07-10 ENCOUNTER — Encounter: Payer: Self-pay | Admitting: Internal Medicine

## 2024-07-10 ENCOUNTER — Other Ambulatory Visit: Payer: Self-pay | Admitting: Internal Medicine

## 2024-07-10 DIAGNOSIS — F17210 Nicotine dependence, cigarettes, uncomplicated: Secondary | ICD-10-CM
